# Patient Record
Sex: Male | Born: 1975 | Race: Black or African American | Hispanic: No | Marital: Married | Smoking: Never smoker
Health system: Southern US, Community
[De-identification: ages and names within clinical notes are randomized; demographics above are authoritative.]

## PROBLEM LIST (undated history)

## (undated) DIAGNOSIS — T7840XA Allergy, unspecified, initial encounter: Secondary | ICD-10-CM

## (undated) HISTORY — DX: Allergy, unspecified, initial encounter: T78.40XA

---

## 2004-06-11 ENCOUNTER — Emergency Department (HOSPITAL_COMMUNITY): Admission: EM | Admit: 2004-06-11 | Discharge: 2004-06-11 | Payer: Self-pay | Admitting: Family Medicine

## 2014-05-09 ENCOUNTER — Emergency Department (HOSPITAL_COMMUNITY)
Admission: EM | Admit: 2014-05-09 | Discharge: 2014-05-09 | Disposition: A | Discharge status: Home / Self Care | Payer: 59 | Source: Home / Self Care | Attending: Family Medicine | Admitting: Family Medicine

## 2014-05-09 ENCOUNTER — Encounter (HOSPITAL_COMMUNITY): Payer: Self-pay | Admitting: Emergency Medicine

## 2014-05-09 DIAGNOSIS — S39012A Strain of muscle, fascia and tendon of lower back, initial encounter: Secondary | ICD-10-CM

## 2014-05-09 MED ORDER — CYCLOBENZAPRINE HCL 5 MG PO TABS: 5.0000 mg | ORAL_TABLET | Freq: Three times a day (TID) | ORAL | Status: DC | PRN

## 2014-05-09 MED ORDER — DICLOFENAC POTASSIUM 50 MG PO TABS: 50.0000 mg | ORAL_TABLET | Freq: Three times a day (TID) | ORAL | Status: DC

## 2014-05-09 NOTE — ED Notes (Signed)
Pt states that he has had back pain for 2 weeks he may have hurt his back either at work or working out per pt.

## 2014-05-09 NOTE — ED Provider Notes (Signed)
CSN: [id number]     Arrival date & time 05/09/14  1416 History   First MD Initiated Contact with Patient 05/09/14 1437     Chief Complaint  Patient presents with  . Back Pain   (Consider location/radiation/quality/duration/timing/severity/associated sxs/prior Treatment) Patient is a 39 y.o. male presenting with back pain. The history is provided by the patient.  Back Pain Location:  Lumbar spine Quality:  Aching Radiates to:  Does not radiate Pain severity:  Mild Pain is:  Worse during the night Onset quality:  Gradual Duration:  2 weeks Progression:  Unchanged Chronicity:  New Context: lifting heavy objects   Relieved by:  None tried Exacerbated by: onset at night during sleep., better in am after up and about. 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   History reviewed. No pertinent past medical history. History reviewed. No pertinent past surgical history. History reviewed. No pertinent family history. History  Substance Use Topics  . Smoking status: Never Smoker   . Smokeless tobacco: Not on file  . Alcohol Use: No    Review of Systems  Constitutional: Negative.   Cardiovascular: Negative for chest pain.  Gastrointestinal: Negative.  Negative for abdominal pain and bowel incontinence.  Genitourinary: Negative.  Negative for bladder incontinence.  Musculoskeletal: Positive for back pain. Negative for gait problem.  Skin: Negative.   Neurological: Negative for tingling, weakness, numbness and paresthesias.    Allergies  Review of patient's allergies indicates no known allergies.  Home Medications   Prior to Admission medications   Medication Sig Start Date End Date Taking? Authorizing Provider  cyclobenzaprine (FLEXERIL) 5 MG tablet Take 1 tablet (5 mg total) by mouth 3 (three) times  daily as needed for muscle spasms. 05/09/14   Linna HoffJames D Alexandro Line, MD  diclofenac (CATAFLAM) 50 MG tablet Take 1 tablet (50 mg total) by mouth 3 (three) times daily. For back pain 05/09/14   Linna HoffJames D Jace Fermin, MD   BP 130/88 mmHg  Pulse 82  Temp(Src) 98.2 F (36.8 C) (Oral)  Resp 18  SpO2 100% Physical Exam  Constitutional: He is oriented to person, place, and time. He appears well-developed and well-nourished.  Abdominal: Soft. Bowel sounds are normal.  Musculoskeletal: Normal range of motion. He exhibits tenderness.       Lumbar back: He exhibits tenderness, pain and spasm. He exhibits normal range of motion, no bony tenderness, no swelling and normal pulse.  Neurological: He is alert and oriented to person, place, and time.  Skin: Skin is warm and dry.  Nursing note and vitals reviewed.   ED Course  Procedures (including critical care time) Labs Review Labs Reviewed - No data to display  Imaging Review No results found.   MDM   1. Low back strain, initial encounter        Linna HoffJames D Lailee Hoelzel, MD 05/09/14 [id number]

## 2014-07-10 ENCOUNTER — Ambulatory Visit (INDEPENDENT_AMBULATORY_CARE_PROVIDER_SITE_OTHER): Payer: 59 | Admitting: Family Medicine

## 2014-07-10 VITALS — BP 118/84 | HR 69 | Temp 98.3°F | Resp 16 | Ht 70.0 in | Wt 276.0 lb

## 2014-07-10 DIAGNOSIS — R300 Dysuria: Secondary | ICD-10-CM

## 2014-07-10 DIAGNOSIS — N390 Urinary tract infection, site not specified: Secondary | ICD-10-CM | POA: Diagnosis not present

## 2014-07-10 LAB — POCT URINALYSIS DIPSTICK
Bilirubin, UA: NEGATIVE
Blood, UA: NEGATIVE
GLUCOSE UA: NEGATIVE
Ketones, UA: NEGATIVE
Leukocytes, UA: NEGATIVE
NITRITE UA: NEGATIVE
Protein, UA: NEGATIVE
Spec Grav, UA: 1.025
Urobilinogen, UA: 0.2
pH, UA: 5.5

## 2014-07-10 LAB — POCT UA - MICROSCOPIC ONLY
BACTERIA, U MICROSCOPIC: NEGATIVE
CASTS, UR, LPF, POC: NEGATIVE
Crystals, Ur, HPF, POC: NEGATIVE
Epithelial cells, urine per micros: NEGATIVE
RBC, URINE, MICROSCOPIC: NEGATIVE
YEAST UA: NEGATIVE

## 2014-07-10 MED ORDER — CIPROFLOXACIN HCL 500 MG PO TABS
500.0000 mg | ORAL_TABLET | Freq: Two times a day (BID) | ORAL | Status: AC
Start: 2014-07-10 — End: 2014-07-20

## 2014-07-10 MED ORDER — PHENAZOPYRIDINE HCL 200 MG PO TABS: 200.0000 mg | ORAL_TABLET | Freq: Three times a day (TID) | ORAL | Status: DC | PRN

## 2014-07-10 NOTE — Progress Notes (Signed)
Discussed with Wallis BambergMario Mani, PA-C.  Symptoms are difficult to explain fully.  Agree with treatment plan.  Possibly psychogenic focusing on the genitalia, and if symptoms persist need to be sure to pursue with him what he is worried about.

## 2014-07-10 NOTE — Progress Notes (Signed)
    MRN: [id number] DOB: 03/18/1976  Subjective:   Caleb Frederick is a 39 y.o. male presenting for chief complaint of Urinary Tract Infection  Reports 3 day history of tingling sensation over penile head. Patient tried hydrocortisone cream 1-2x with some relief. Denies fevers, dysuria, hematuria, penile discharge, genital rashes, urinary frequency, nocturia, cloudy or malodorous urine. Patient is sexually active with 1 male, wears condoms. Denies history of sexually transmitted infections. Denies smoking or alcohol use. Denies any other aggravating or relieving factors, no other questions or concerns.  Caleb Frederick has a current medication list which includes the following prescription(s): cyclobenzaprine and diclofenac. He has No Known Allergies.  Caleb Frederick  has a past medical history of Allergy. Also  has no past surgical history on file.  ROS As in subjective.  Objective:   Vitals: BP 118/84 mmHg  Pulse 69  Temp(Src) 98.3 F (36.8 C) (Oral)  Resp 16  Ht 5\' 10"  (1.778 m)  Wt 276 lb (125.193 kg)  BMI 39.60 kg/m2  SpO2 98%  Physical Exam  Constitutional: He is oriented to person, place, and time.  Cardiovascular: Normal rate.   Pulmonary/Chest: Effort normal.  Genitourinary: He exhibits no abnormal testicular mass, no testicular tenderness, no abnormal scrotal mass, no scrotal tenderness and no epididymal tenderness. Penis exhibits no lesions and no edema. No discharge found.  Neurological: He is alert and oriented to person, place, and time.  Skin: Skin is warm and dry. No rash noted. No erythema. No pallor.  No inguinal lymphadenopathy.   Results for orders placed or performed in visit on 07/10/14 (from the past 24 hour(s))  POCT UA - Microscopic Only     Status: None   Collection Time: 07/10/14  3:00 PM  Result Value Ref Range   WBC, Ur, HPF, POC 0-2    RBC, urine, microscopic neg    Bacteria, U Microscopic neg    Mucus, UA small    Epithelial cells, urine per micros neg      Crystals, Ur, HPF, POC neg    Casts, Ur, LPF, POC neg    Yeast, UA neg   POCT urinalysis dipstick     Status: None   Collection Time: 07/10/14  3:00 PM  Result Value Ref Range   Color, UA yellow    Clarity, UA clear    Glucose, UA neg    Bilirubin, UA neg    Ketones, UA neg    Spec Grav, UA 1.025    Blood, UA neg    pH, UA 5.5    Protein, UA neg    Urobilinogen, UA 0.2    Nitrite, UA neg    Leukocytes, UA Negative    Assessment and Plan :   1. Urinary tract infection without hematuria, site unspecified 2. Dysuria - Unclear etiology for his penile head tingling sensation, may be psychogenic but labs and urine culture are pending, consider antibiotic course if symptoms worsen. Patient stated a voice message with results was okay.  Wallis BambergMario Hillarie Harrigan, PA-C [facility] [phone] 07/10/2014 2:53 PM

## 2014-07-10 NOTE — Patient Instructions (Addendum)
-   Start pyridium today for the next 5 days to help with your symptoms. - Hold on to ciprofloxacin prescription, start this in 3-5 days if your symptoms have not improved or worsen.    Urinary Tract Infection Urinary tract infections (UTIs) can develop anywhere along your urinary tract. Your urinary tract is your body's drainage system for removing wastes and extra water. Your urinary tract includes two kidneys, two ureters, a bladder, and a urethra. Your kidneys are a pair of bean-shaped organs. Each kidney is about the size of your fist. They are located below your ribs, one on each side of your spine. CAUSES Infections are caused by microbes, which are microscopic organisms, including fungi, viruses, and bacteria. These organisms are so small that they can only be seen through a microscope. Bacteria are the microbes that most commonly cause UTIs. SYMPTOMS  Symptoms of UTIs may vary by age and gender of the patient and by the location of the infection. Symptoms in young women typically include a frequent and intense urge to urinate and a painful, burning feeling in the bladder or urethra during urination. Older women and men are more likely to be tired, shaky, and weak and have muscle aches and abdominal pain. A fever may mean the infection is in your kidneys. Other symptoms of a kidney infection include pain in your back or sides below the ribs, nausea, and vomiting. DIAGNOSIS To diagnose a UTI, your caregiver will ask you about your symptoms. Your caregiver also will ask to provide a urine sample. The urine sample will be tested for bacteria and white blood cells. White blood cells are made by your body to help fight infection. TREATMENT  Typically, UTIs can be treated with medication. Because most UTIs are caused by a bacterial infection, they usually can be treated with the use of antibiotics. The choice of antibiotic and length of treatment depend on your symptoms and the type of bacteria causing  your infection. HOME CARE INSTRUCTIONS  If you were prescribed antibiotics, take them exactly as your caregiver instructs you. Finish the medication even if you feel better after you have only taken some of the medication.  Drink enough water and fluids to keep your urine clear or pale yellow.  Avoid caffeine, tea, and carbonated beverages. They tend to irritate your bladder.  Empty your bladder often. Avoid holding urine for long periods of time.  Empty your bladder before and after sexual intercourse.  After a bowel movement, women should cleanse from front to back. Use each tissue only once. SEEK MEDICAL CARE IF:   You have back pain.  You develop a fever.  Your symptoms do not begin to resolve within 3 days. SEEK IMMEDIATE MEDICAL CARE IF:   You have severe back pain or lower abdominal pain.  You develop chills.  You have nausea or vomiting.  You have continued burning or discomfort with urination. MAKE SURE YOU:   Understand these instructions.  Will watch your condition.  Will get help right away if you are not doing well or get worse. Document Released: 01/26/2005 Document Revised: 10/18/2011 Document Reviewed: 05/27/2011 [facility] Patient Information 2015 [place] [place]. This information is not intended to replace advice given to you by your health care provider. Make sure you discuss any questions you have with your health care provider.

## 2014-07-11 LAB — URINE CULTURE
Colony Count: NO GROWTH
Organism ID, Bacteria: NO GROWTH

## 2014-07-13 LAB — GC/CHLAMYDIA PROBE AMP
Chlamydia trachomatis, NAA: NEGATIVE
Neisseria gonorrhoeae by PCR: NEGATIVE

## 2014-12-26 ENCOUNTER — Ambulatory Visit (INDEPENDENT_AMBULATORY_CARE_PROVIDER_SITE_OTHER): Payer: Self-pay | Admitting: Family Medicine

## 2014-12-26 VITALS — BP 114/80 | HR 59 | Temp 98.3°F | Resp 18 | Ht 69.5 in | Wt 281.0 lb

## 2014-12-26 DIAGNOSIS — Z0000 Encounter for general adult medical examination without abnormal findings: Secondary | ICD-10-CM

## 2014-12-26 NOTE — Progress Notes (Signed)
[facility] [address] [place] [address] [phone]  Date:  12/26/2014   Name:  Caleb Frederick   DOB:  06/30/1975   MRN:  [id number]  PCP:  No primary care provider on file.    Chief Complaint: Employment Physical   History of Present Illness:  Caleb Frederick is a 39 y.o. 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  Generally in good health No medication or chronic health concerns   There are no active problems to display for this patient.   Past Medical History  Diagnosis Date  . Allergy     History reviewed. No pertinent past surgical history.  Social History  Substance Use Topics  . Smoking status: Never Smoker   . Smokeless tobacco: None  . Alcohol Use: No    Family History  Problem Relation Age of Onset  . Mental illness Mother   . Heart disease Maternal Grandmother     No Known Allergies  Medication list has been reviewed and updated.  No current outpatient prescriptions on file prior to visit.   No current facility-administered medications on file prior to visit.    Review of Systems:  As per HPI- otherwise negative.   Physical Examination: Filed Vitals:   12/26/14 1314  BP: 114/80  Pulse: 59  Temp: 98.3 F (36.8 C)  Resp: 18   Filed Vitals:   12/26/14 1314  Height: 5' 9.5" (1.765 m)  Weight: 281 lb (127.461 kg)   Body mass index is 40.92 kg/(m^2). Ideal Body Weight: Weight in (lb) to have BMI = 25: 171.4  GEN: WDWN, NAD, Non-toxic, A & O x 3, overweight, looks well HEENT: Atraumatic, Normocephalic. Neck supple. No masses, No LAD.  Bilateral TM wnl, oropharynx normal.  PEERL,EOMI.   Ears and Nose: No external deformity. CV: RRR, No M/G/R. No JVD. No thrill. No extra heart sounds. PULM: CTA B, no wheezes, crackles, rhonchi. No retractions. No resp. distress. No accessory muscle use. ABD: S, NT, ND. No rebound. No HSM. EXTR: No c/c/e NEURO Normal  gait.  PSYCH: Normally interactive. Conversant. Not depressed or anxious appearing.  Calm demeanor.  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

## 2015-05-12 ENCOUNTER — Ambulatory Visit (INDEPENDENT_AMBULATORY_CARE_PROVIDER_SITE_OTHER): Payer: Managed Care, Other (non HMO)

## 2015-05-12 ENCOUNTER — Ambulatory Visit (INDEPENDENT_AMBULATORY_CARE_PROVIDER_SITE_OTHER): Payer: Managed Care, Other (non HMO) | Admitting: Internal Medicine

## 2015-05-12 VITALS — BP 124/86 | HR 71 | Temp 97.9°F | Resp 18 | Ht 69.5 in | Wt 286.6 lb

## 2015-05-12 DIAGNOSIS — M542 Cervicalgia: Secondary | ICD-10-CM

## 2015-05-12 DIAGNOSIS — M5412 Radiculopathy, cervical region: Secondary | ICD-10-CM

## 2015-05-12 DIAGNOSIS — Z6841 Body Mass Index (BMI) 40.0 and over, adult: Secondary | ICD-10-CM | POA: Diagnosis not present

## 2015-05-12 MED ORDER — CYCLOBENZAPRINE HCL 10 MG PO TABS: 10.0000 mg | ORAL_TABLET | Freq: Every day | ORAL | Status: DC

## 2015-05-12 MED ORDER — MELOXICAM 15 MG PO TABS: 15.0000 mg | ORAL_TABLET | Freq: Every day | ORAL | Status: DC

## 2015-05-12 MED ORDER — PREDNISONE 20 MG PO TABS: ORAL_TABLET | ORAL | Status: DC

## 2015-05-12 NOTE — Progress Notes (Addendum)
Subjective:  By signing my name below, I, [name], attest that this documentation has been prepared under the direction and in the presence of [name], MD.  Watt Climes Rifaie, Medical Scribe. 05/12/2015.  1:03 PM.   Patient ID: Caleb Frederick, male    DOB: [birth year], 40 y.o.   MRN: [id number]  Chief Complaint  Patient presents with  . Neck Pain    extending down arm    HPI HPI Comments: Caleb Frederick is a 40 y.o. male who [facility] complaining of right neck pain, gradual onset 3-4 months ago.  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 He indicates that he bleieved that his symptoms initally were secondary to his sleeping position in the truck. He indicates that he was taking Ibuprofen for the intermittent symptoms which he found relief with. Pt reports that recently, the pain started radiating to his left arm with associated weakness of the arm. He also reports associated difficulty sleeping due to the pain. Pt denies previous neck injury, tingling of the left arm.    Healthy on no meds.   There are no active problems to display for this patient.  Past Medical History  Diagnosis Date  . Allergy    History reviewed. No pertinent past surgical history. No Known Allergies Prior to Admission medications   Medication Sig Start Date End Date Taking? Authorizing Provider  IBUPROFEN PO Take by mouth.   Yes Historical Provider, MD   Social History   Social History  . Marital Status: Single    Spouse Name: N/A  . Number of Children: N/A  . Years of Education: N/A   Occupational History  . Not on file.   Social History Main Topics  . Smoking status: Never Smoker   . Smokeless tobacco: Not on file  . Alcohol Use: No  . Drug Use: No  . Sexual Activity: Not on file   Other Topics Concern  . Not on file   Social History Narrative    Review of Systems   Constitutional: Negative for fatigue and unexpected weight change.  Eyes: Negative for visual disturbance.  Respiratory: Negative for shortness of breath.   Cardiovascular: Negative for chest pain.  Musculoskeletal: Positive for myalgias, neck pain and neck stiffness. Negative for gait problem.  Neurological: Negative for dizziness, numbness and headaches.  Psychiatric/Behavioral: Positive for sleep disturbance.      Objective:   Physical Exam  Constitutional: He is oriented to person, place, and time. He appears well-developed and well-nourished. No distress.  HENT:  Head: Normocephalic and atraumatic.  Eyes: Conjunctivae and EOM are normal. Pupils are equal, round, and reactive to light.  Neck: Neck supple.  Neck rom decr due to discomfort, espec rot to L and extens  Cardiovascular: Normal rate.   Pulmonary/Chest: Effort normal.  Musculoskeletal:   Shoulder L ROM is intact.Tender to palpation to the left posterior cervical and left trapezius.  Grip str good bilat w/out sens losses in upper extr  Lymphadenopathy:    He has no cervical adenopathy.  Neurological: He is alert and oriented to person, place, and time. No cranial nerve deficit.  Skin: Skin is warm and dry.  Psychiatric: He has a normal mood and affect. His behavior is normal.  Nursing note and vitals reviewed.    BP 124/86 mmHg  Pulse 71  Temp(Src) 97.9 F (36.6 C) (Oral)  Resp 18  Ht 5' 9.5" (1.765 m)  Wt 286 lb 9.6 oz (130.001 kg)  BMI 41.73 kg/m2  SpO2 98% [facility] reading (PRIMARY) by  Dr. Anhar Mcdermott=no narrowing or degen changes.      Assessment & Plan:  Neck pain - Plan: DG Cervical Spine 2 or 3 views  Cervical radiculitis - Plan: DG Cervical Spine 2 or 3 views  This should be muscle strain injury with repetitive reinjury driving.  Meds ordered this encounter  Medications  . IBUPROFEN PO    Sig: Take by mouth.  . cyclobenzaprine (FLEXERIL) 10 MG tablet    Sig: Take 1 tablet (10 mg total) by mouth  at bedtime.    Dispense:  30 tablet    Refill:  0  . predniSONE (DELTASONE) 20 MG tablet    Sig: 3/3/2/2/1/1 single daily dose for 6 days    Dispense:  12 tablet    Refill:  0  . meloxicam (MOBIC) 15 MG tablet    Sig: Take 1 tablet (15 mg total) by mouth daily.    Dispense:  30 tablet    Refill:  0   Neck rom To pt 2 weeks if not well  I have completed the patient encounter in its entirety as documented by the scribe, with editing by me where necessary. Caleb Frederick Caleb Frederick, M.D.

## 2016-02-15 HISTORY — PX: VASECTOMY: SHX75

## 2016-04-11 ENCOUNTER — Telehealth (HOSPITAL_COMMUNITY): Payer: Self-pay | Admitting: Emergency Medicine

## 2016-04-11 ENCOUNTER — Ambulatory Visit (HOSPITAL_COMMUNITY)
Admission: EM | Admit: 2016-04-11 | Discharge: 2016-04-11 | Disposition: A | Discharge status: Home / Self Care | Payer: Managed Care, Other (non HMO) | Attending: Family Medicine | Admitting: Family Medicine

## 2016-04-11 ENCOUNTER — Encounter (HOSPITAL_COMMUNITY): Payer: Self-pay | Admitting: Emergency Medicine

## 2016-04-11 DIAGNOSIS — B9789 Other viral agents as the cause of diseases classified elsewhere: Secondary | ICD-10-CM | POA: Diagnosis not present

## 2016-04-11 DIAGNOSIS — J069 Acute upper respiratory infection, unspecified: Secondary | ICD-10-CM | POA: Diagnosis not present

## 2016-04-11 MED ORDER — PHENYLEPHRINE-CHLORPHEN-DM 10-4-12.5 MG/5ML PO LIQD: 5.0000 mL | ORAL | 0 refills | Status: DC | PRN

## 2016-04-11 NOTE — Telephone Encounter (Signed)
Hayden Rasmussenavid Mabe, NP spoke with pharmacy

## 2016-04-11 NOTE — ED Provider Notes (Signed)
CSN: [id number]     Arrival date & time 04/11/16  1701 History   First MD Initiated Contact with Patient 04/11/16 1756     Chief Complaint  Patient presents with  . Cough   (Consider location/radiation/quality/duration/timing/severity/associated sxs/prior Treatment) 40 year old male complaining of cough and congestion. Complaining of head congestion and chest congestion. Symptoms began about a week ago. Denies fever or chills. 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 Denies fever.      Past Medical History:  Diagnosis Date  . Allergy    History reviewed. No pertinent surgical history. Family History  Problem Relation Age of Onset  . Mental illness Mother   . Heart disease Maternal Grandmother    Social History  Substance Use Topics  . Smoking status: Never Smoker  . Smokeless tobacco: Not on file  . Alcohol use No    Review of Systems  Constitutional: Positive for activity change. Negative for chills, diaphoresis, fatigue and fever.  HENT: Positive for congestion, postnasal drip and rhinorrhea. Negative for ear pain, facial swelling, sore throat and trouble swallowing.   Eyes: Negative for pain, discharge and redness.  Respiratory: Positive for cough. Negative for chest tightness and shortness of breath.   Cardiovascular: Negative.   Gastrointestinal: Negative.   Musculoskeletal: Negative.  Negative for neck pain and neck stiffness.  Neurological: Negative.   All other systems reviewed and are negative.   Allergies  Patient has no known allergies.  Home Medications   Prior to Admission medications   Medication Sig Start Date End Date Taking? Authorizing Provider  cyclobenzaprine (FLEXERIL) 10 MG tablet Take 1 tablet (10 mg total) by mouth at bedtime. 05/12/15   Tonye Pearsonobert P Doolittle, MD  IBUPROFEN PO Take by mouth.    Historical Provider, MD  meloxicam (MOBIC) 15 MG tablet Take 1 tablet (15 mg total) by mouth daily. 05/12/15    Tonye Pearsonobert P Doolittle, MD  predniSONE (DELTASONE) 20 MG tablet 3/3/2/2/1/1 single daily dose for 6 days 05/12/15   Tonye Pearsonobert P Doolittle, MD   Meds Ordered and Administered this Visit  Medications - No data to display  BP 132/82 (BP Location: Right Arm)   Pulse 82   Temp 98.1 F (36.7 C) (Oral)   Resp 18   SpO2 100%  No data found.   Physical Exam  Constitutional: He is oriented to person, place, and time. He appears well-developed and well-nourished. No distress.  HENT:  Head: Normocephalic and atraumatic.  Right Ear: External ear normal.  Left Ear: External ear normal.  Mouth/Throat: No oropharyngeal exudate.  Bilateral TMs are normal. Oropharynx with minimal erythema and moderate amount of clear PND.  Eyes: EOM are normal. Pupils are equal, round, and reactive to light.  Neck: Normal range of motion. Neck supple.  Cardiovascular: Normal rate, regular rhythm and normal heart sounds.   Pulmonary/Chest: Effort normal and breath sounds normal. No respiratory distress. He has no wheezes. He has no rales.  Musculoskeletal: Normal range of motion. He exhibits no edema.  Neurological: He is alert and oriented to person, place, and time.  Skin: Skin is warm and dry.  Nursing note and vitals reviewed.   Urgent [facility] Course   Clinical Course     Procedures (including critical care time)  Labs Review Labs Reviewed - No data to display  Imaging Review No results found.   Visual Acuity Review  Right Eye Distance:   Left Eye Distance:   Bilateral Distance:  Right Eye Near:   Left Eye Near:    Bilateral Near:         MDM   1. Viral URI with cough    Sudafed PE 10 mg every 4 to 6 hours as needed for congestion Allegra or Zyrtec daily as needed for drainage and runny nose. Saline nasal spray used frequently. Ibuprofen 600 mg every 6 hours as needed for pain, discomfort or fever. Drink plenty of fluids and stay well-hydrated  For Home Use You are receiving a  prescription bottle of medication for drainage, congestion and cough. He may take this when at home and not driving. When taking these medications he did not need to take additional decongestants or antihistamines.      Hayden Rasmussenavid Shirlette Scarber, NP 04/11/16 [phone]

## 2016-04-11 NOTE — ED Triage Notes (Signed)
[facility] a [facility] with nasal and chest congestion x 1 week. The patient reported using OTC meds with minimal results.

## 2016-04-11 NOTE — Discharge Instructions (Signed)
Sudafed PE 10 mg every 4 to 6 hours as needed for congestion Allegra or Zyrtec daily as needed for drainage and runny nose. Saline nasal spray used frequently. Ibuprofen 600 mg every 6 hours as needed for pain, discomfort or fever. Drink plenty of fluids and stay well-hydrated  For Home Use You are receiving a prescription bottle of medication for drainage, congestion and cough. He may take this when at home and not driving. When taking these medications he did not need to take additional decongestants or antihistamines.

## 2016-10-12 ENCOUNTER — Encounter (HOSPITAL_COMMUNITY): Payer: Self-pay | Admitting: Emergency Medicine

## 2016-10-12 ENCOUNTER — Ambulatory Visit (HOSPITAL_COMMUNITY)
Admission: EM | Admit: 2016-10-12 | Discharge: 2016-10-12 | Disposition: A | Discharge status: Home / Self Care | Payer: 59 | Attending: Family Medicine | Admitting: Family Medicine

## 2016-10-12 DIAGNOSIS — T7849XA Other allergy, initial encounter: Secondary | ICD-10-CM | POA: Insufficient documentation

## 2016-10-12 DIAGNOSIS — R300 Dysuria: Secondary | ICD-10-CM

## 2016-10-12 DIAGNOSIS — T7840XA Allergy, unspecified, initial encounter: Secondary | ICD-10-CM | POA: Diagnosis not present

## 2016-10-12 DIAGNOSIS — X58XXXA Exposure to other specified factors, initial encounter: Secondary | ICD-10-CM | POA: Diagnosis not present

## 2016-10-12 DIAGNOSIS — R3915 Urgency of urination: Secondary | ICD-10-CM

## 2016-10-12 LAB — POCT URINALYSIS DIP (DEVICE)
Bilirubin Urine: NEGATIVE
Glucose, UA: NEGATIVE mg/dL
Hgb urine dipstick: NEGATIVE
Ketones, ur: NEGATIVE mg/dL
Leukocytes, UA: NEGATIVE
Nitrite: NEGATIVE
PH: 5.5 (ref 5.0–8.0)
Protein, ur: NEGATIVE mg/dL
Specific Gravity, Urine: >=1.03 (ref 1.005–1.030)
UROBILINOGEN UA: 0.2 mg/dL (ref 0.0–1.0)

## 2016-10-12 LAB — GLUCOSE, CAPILLARY: Glucose-Capillary: 114 mg/dL — ABNORMAL HIGH (ref 65–99)

## 2016-10-12 MED ORDER — PREDNISONE 10 MG (21) PO TBPK: ORAL_TABLET | ORAL | 0 refills | Status: DC

## 2016-10-12 NOTE — Discharge Instructions (Signed)
To cover for possible allergic reaction, prescribed prednisone, take as directed, also take over-the-counter Benadryl 2 tablets every 6 hours not to exceed 300 mg any 24-hour period. Urine is also being tested for further infective causes, if positive we will notify used 3-5 business days. If your symptoms persist return to clinic as needed, or follow-up with urology.I have attached the contact information for [facility], contact them to schedule an appointment to establish for primary care.

## 2016-10-12 NOTE — ED Provider Notes (Signed)
CSN: [id number]     Arrival date & time 10/12/16  1652 History   First MD Initiated Contact with Patient 10/12/16 1729     Chief Complaint  Patient presents with  . Urinary Urgency   (Consider location/radiation/quality/duration/timing/severity/associated sxs/prior Treatment) 41 year old male presents to clinic with a chief complaint of urinary discomfort. States that previously he has had an allergic reaction to condoms, and K-Y jelly, in that he use these products recently, and believes he might be having another reaction. He is also concerned about possible UTI. However, he is not concerned with STDs, stating he is monogamous relationship. He has had no penile discharge, sores, lesions, lymphadenopathy, fever, chills, or other systemic symptoms. He is also requesting to have his blood sugar checked, and concerned about the possibility of diabetes.   The history is provided by the patient.    Past Medical History:  Diagnosis Date  . Allergy    History reviewed. No pertinent surgical history. Family History  Problem Relation Age of Onset  . Mental illness Mother   . Heart disease Maternal Grandmother    Social History  Substance Use Topics  . Smoking status: Never Smoker  . Smokeless tobacco: Not on file  . Alcohol use No    Review of Systems  Constitutional: Negative.   HENT: Negative.   Respiratory: Negative.   Cardiovascular: Negative.   Gastrointestinal: Negative.   Genitourinary: Positive for dysuria. Negative for discharge, frequency, penile swelling and urgency.  Musculoskeletal: Negative.   Skin: Negative.   Neurological: Negative.     Allergies  Patient has no known allergies.  Home Medications   Prior to Admission medications   Medication Sig Start Date End Date Taking? Authorizing Provider  predniSONE (STERAPRED UNI-PAK 21 TAB) 10 MG (21) TBPK tablet Take 6 tablets tomorrow, decrease by 1 each day till finished (7,8,2,9,5,6) 10/12/16   Dorena Bodo,  NP   Meds Ordered and Administered this Visit  Medications - No data to display  BP 130/82 (BP Location: Right Arm)   Pulse 78   Temp 98.2 F (36.8 C) (Oral)   Resp 18   SpO2 98%  No data found.   Physical Exam  Constitutional: He is oriented to person, place, and time. He appears well-developed and well-nourished. No distress.  HENT:  Head: Normocephalic and atraumatic.  Right Ear: External ear normal.  Left Ear: External ear normal.  Eyes: Conjunctivae are normal.  Neck: Normal range of motion.  Genitourinary: Right testis shows no tenderness. Left testis shows no tenderness. Circumcised. Penile erythema present. No discharge found.  Genitourinary Comments: Area of erythema near the urinary meatus, no discharge or other lesions noted.  Lymphadenopathy: No inguinal adenopathy noted on the right or left side.  Neurological: He is alert and oriented to person, place, and time.  Skin: Skin is warm and dry. Capillary refill takes less than 2 seconds. He is not diaphoretic.  Psychiatric: He has a normal mood and affect. His behavior is normal.  Nursing note and vitals reviewed.   Urgent Care Course     Procedures (including critical care time)  Labs Review Labs Reviewed  GLUCOSE, CAPILLARY - Abnormal; Notable for the following:       Result Value   Glucose-Capillary 114 (*)    All other components within normal limits  POCT URINALYSIS DIP (DEVICE)  URINE CYTOLOGY ANCILLARY ONLY    Imaging Review No results found.      MDM   1. Allergic reaction, initial encounter  Patient's glucose tested at his request, counseling was provided with regard to single reading not being diagnostic catheter for against diabetes and the importance to establish with primary care for further evaluation.  Treating for allergic reaction, given prednisone, provided counseling on over-the-counter Benadryl use. Urine sent to lab for testing for gonorrhea, chlamydia, Trichomonas, symptoms  persist return to clinic, follow-up with primary care.      Dorena BodoKennard, Renetta Suman, NP 10/12/16 1844

## 2016-10-12 NOTE — ED Notes (Signed)
Dirty and clean urine collected. 

## 2016-10-12 NOTE — ED Triage Notes (Signed)
[facility] with a complaint of urinary urgency and some occasional pain to the tip of his penis. The patient denied any penile discharge. The patient 4 days.

## 2016-10-14 LAB — URINE CYTOLOGY ANCILLARY ONLY
CHLAMYDIA, DNA PROBE: NEGATIVE
NEISSERIA GONORRHEA: NEGATIVE
TRICH (WINDOWPATH): NEGATIVE

## 2016-12-07 ENCOUNTER — Ambulatory Visit (INDEPENDENT_AMBULATORY_CARE_PROVIDER_SITE_OTHER): Payer: Self-pay | Admitting: Family Medicine

## 2016-12-07 ENCOUNTER — Encounter: Payer: Self-pay | Admitting: Family Medicine

## 2016-12-07 VITALS — BP 129/84 | HR 65 | Temp 98.5°F | Resp 18 | Ht 69.25 in | Wt 296.6 lb

## 2016-12-07 DIAGNOSIS — Z024 Encounter for examination for driving license: Secondary | ICD-10-CM

## 2016-12-07 NOTE — Progress Notes (Signed)
Commercial Driver Medical Examination   Caleb HagemanJermaine Frederick is a 41 y.o. male who presents today for a commercial driver fitness determination physical exam. The patient reports no problems. The following portions of the patient's history were reviewed and updated as appropriate: allergies, current medications, past family history, past medical history, past social history, past surgical history and problem list. Review of Systems Pertinent items are noted in HPI.   Objective:    Vision:   Visual Acuity Screening   Right eye Left eye Both eyes  With correction:     Without correction: 20/15 20/15 20/15   Comments: R 85 degrees  L 85 degrees  Able to distinguish colors   Hearing Screening Comments: R 3915ft L  7615ft  Applicant can recognize and distinguish among traffic control signals and devices showing standard red, green, and amber colors.     Monocular Vision?: No   Hearing:     BP 129/84   Pulse 65   Temp 98.5 F (36.9 C) (Oral)   Resp 18   Ht 5' 9.25" (1.759 m)   Wt 296 lb 9.6 oz (134.5 kg)   SpO2 99%   BMI 43.48 kg/m   General Appearance:    Alert, cooperative, no distress, appears stated age  Head:    Normocephalic, without obvious abnormality, atraumatic  Eyes:    PERRL, conjunctiva/corneas clear, EOM's intact, fundi    benign, both eyes       Ears:    Normal TM's and external ear canals, both ears  Nose:   Nares normal, septum midline, mucosa normal, no drainage    or sinus tenderness  Throat:   Lips, mucosa, and tongue normal; teeth and gums normal  Neck:   Supple, symmetrical, trachea midline, no adenopathy;       thyroid:  No enlargement/tenderness/nodules; no carotid   bruit or JVD  Back:     Symmetric, no curvature, ROM normal, no CVA tenderness  Lungs:     Clear to auscultation bilaterally, respirations unlabored  Chest wall:    No tenderness or deformity  Heart:    Regular rate and rhythm, S1 and S2 normal, no murmur, rub   or gallop  Abdomen:      Soft, non-tender, bowel sounds active all four quadrants,    no masses, no organomegaly        Extremities:   Extremities normal, atraumatic, no cyanosis or edema  Pulses:   2+ and symmetric all extremities  Skin:   Skin color, texture, turgor normal, no rashes or lesions  Lymph nodes:   Cervical, supraclavicular, and axillary nodes normal  Neurologic:   CNII-XII intact. Normal strength, sensation and reflexes      throughout    Labs: Lab Results  Component Value Date   SPECGRAV 1.025 07/10/2014   PROTEINUR NEGATIVE 10/12/2016   BILIRUBINUR NEGATIVE 10/12/2016   GLUCOSEU NEGATIVE 10/12/2016   SG 1.020, not prot, no bld, no sugar    Assessment:    Healthy male exam.  Meets standards in 5949 CFR 391.41;  qualifies for 2 year certificate.    Plan:    Medical examiners certificate completed and printed. Return as needed.

## 2016-12-07 NOTE — Patient Instructions (Signed)
     IF you received an x-ray today, you will receive an invoice from [facility]. Please contact [facility] at [phone] with questions or concerns regarding your invoice.   IF you received labwork today, you will receive an invoice from [facility]. Please contact [facility] at [phone] with questions or concerns regarding your invoice.   Our billing staff will not be able to assist you with questions regarding bills from these companies.  You will be contacted with the lab results as soon as they are available. The fastest way to get your results is to activate your My Chart account. Instructions are located on the last page of this paperwork. If you have not heard from us regarding the results in 2 weeks, please contact this office.     

## 2017-08-17 ENCOUNTER — Other Ambulatory Visit: Payer: Self-pay

## 2017-08-17 ENCOUNTER — Encounter: Payer: Self-pay | Admitting: Family Medicine

## 2017-08-17 ENCOUNTER — Ambulatory Visit (INDEPENDENT_AMBULATORY_CARE_PROVIDER_SITE_OTHER): Payer: 59 | Admitting: Family Medicine

## 2017-08-17 VITALS — BP 100/60 | HR 94 | Temp 98.0°F | Ht 70.0 in | Wt 294.0 lb

## 2017-08-17 DIAGNOSIS — M545 Low back pain, unspecified: Secondary | ICD-10-CM

## 2017-08-17 MED ORDER — CYCLOBENZAPRINE HCL 10 MG PO TABS: 10.0000 mg | ORAL_TABLET | Freq: Three times a day (TID) | ORAL | 0 refills | Status: DC | PRN

## 2017-08-17 MED ORDER — IBUPROFEN 800 MG PO TABS: 800.0000 mg | ORAL_TABLET | Freq: Three times a day (TID) | ORAL | 0 refills | Status: DC | PRN

## 2017-08-17 MED ORDER — GABAPENTIN 300 MG PO CAPS: 300.0000 mg | ORAL_CAPSULE | Freq: Three times a day (TID) | ORAL | 0 refills | Status: DC | PRN

## 2017-08-17 NOTE — Patient Instructions (Addendum)
     IF you received an x-ray today, you will receive an invoice from [facility]. Please contact [facility] at [phone] with questions or concerns regarding your invoice.   IF you received labwork today, you will receive an invoice from [facility]. Please contact [facility] at [phone] with questions or concerns regarding your invoice.   Our billing staff will not be able to assist you with questions regarding bills from these companies.  You will be contacted with the lab results as soon as they are available. The fastest way to get your results is to activate your My Chart account. Instructions are located on the last page of this paperwork. If you have not heard from us regarding the results in 2 weeks, please contact this office.      Back Pain, Adult Back pain is very common. The pain often gets better over time. The cause of back pain is usually not dangerous. Most people can learn to manage their back pain on their own. Follow these instructions at home: Watch your back pain for any changes. The following actions may help to lessen any pain you are feeling:  Stay active. Start with short walks on flat ground if you can. Try to walk farther each day.  Exercise regularly as told by your doctor. Exercise helps your back heal faster. It also helps avoid future injury by keeping your muscles strong and flexible.  Do not sit, drive, or stand in one place for more than 30 minutes.  Do not stay in bed. Resting more than 1-2 days can slow down your recovery.  Be careful when you bend or lift an object. Use good form when lifting: ? Bend at your knees. ? Keep the object close to your body. ? Do not twist.  Sleep on a firm mattress. Lie on your side, and bend your knees. If you lie on your back, put a pillow under your knees.  Take medicines only as told by your doctor.  Put ice on the injured area. ? Put ice in a plastic bag. ? Place a towel between your  skin and the bag. ? Leave the ice on for 20 minutes, 2-3 times a day for the first 2-3 days. After that, you can switch between ice and heat packs.  Avoid feeling anxious or stressed. Find good ways to deal with stress, such as exercise.  Maintain a healthy weight. Extra weight puts stress on your back.  Contact a doctor if:  You have pain that does not go away with rest or medicine.  You have worsening pain that goes down into your legs or buttocks.  You have pain that does not get better in one week.  You have pain at night.  You lose weight.  You have a fever or chills. Get help right away if:  You cannot control when you poop (bowel movement) or pee (urinate).  Your arms or legs feel weak.  Your arms or legs lose feeling (numbness).  You feel sick to your stomach (nauseous) or throw up (vomit).  You have belly (abdominal) pain.  You feel like you may pass out (faint). This information is not intended to replace advice given to you by your health care provider. Make sure you discuss any questions you have with your health care provider. Document Released: 10/05/2007 Document Revised: 09/24/2015 Document Reviewed: 08/20/2013 Elsevier Interactive Patient Education  2018 Elsevier Inc.  
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
## 2017-08-25 ENCOUNTER — Ambulatory Visit (INDEPENDENT_AMBULATORY_CARE_PROVIDER_SITE_OTHER): Payer: 59 | Admitting: Family Medicine

## 2017-08-25 ENCOUNTER — Encounter: Payer: Self-pay | Admitting: Family Medicine

## 2017-08-25 ENCOUNTER — Other Ambulatory Visit: Payer: Self-pay

## 2017-08-25 VITALS — BP 132/84 | HR 76 | Temp 98.1°F | Ht 70.67 in | Wt 297.0 lb

## 2017-08-25 DIAGNOSIS — M545 Low back pain, unspecified: Secondary | ICD-10-CM

## 2017-08-25 MED ORDER — METHOCARBAMOL 750 MG PO TABS: 750.0000 mg | ORAL_TABLET | Freq: Four times a day (QID) | ORAL | 0 refills | Status: DC

## 2017-08-25 NOTE — Patient Instructions (Addendum)
IF you received an x-ray today, you will receive an invoice from [facility]. Please contact [facility] at [phone] with questions or concerns regarding your invoice.   IF you received labwork today, you will receive an invoice from [facility]. Please contact [facility] at [phone] with questions or concerns regarding your invoice.   Our billing staff will not be able to assist you with questions regarding bills from these companies.  You will be contacted with the lab results as soon as they are available. The fastest way to get your results is to activate your My Chart account. Instructions are located on the last page of this paperwork. If you have not heard from us regarding the results in 2 weeks, please contact this office.     Low Back Strain Rehab Ask your health care provider which exercises are safe for you. Do exercises exactly as told by your health care provider and adjust them as directed. It is normal to feel mild stretching, pulling, tightness, or discomfort as you do these exercises, but you should stop right away if you feel sudden pain or your pain gets worse. Do not begin these exercises until told by your health care provider. Stretching and range of motion exercises These exercises warm up your muscles and joints and improve the movement and flexibility of your back. These exercises also help to relieve pain, numbness, and tingling. Exercise A: Single knee to chest  1. Lie on your back on a firm surface with both legs straight. 2. Bend one of your knees. Use your hands to move your knee up toward your chest until you feel a gentle stretch in your lower back and buttock. ? Hold your leg in this position by holding onto the front of your knee. ? Keep your other leg as straight as possible. 3. Hold for __________ seconds. 4. Slowly return to the starting position. 5. Repeat with your other leg. Repeat __________ times. Complete this exercise  __________ times a day. Exercise B: Prone extension on elbows  1. Lie on your abdomen on a firm surface. 2. Prop yourself up on your elbows. 3. Use your arms to help lift your chest up until you feel a gentle stretch in your abdomen and your lower back. ? This will place some of your body weight on your elbows. If this is uncomfortable, try stacking pillows under your chest. ? Your hips should stay down, against the surface that you are lying on. Keep your hip and back muscles relaxed. 4. Hold for __________ seconds. 5. Slowly relax your upper body and return to the starting position. Repeat __________ times. Complete this exercise __________ times a day. Strengthening exercises These exercises build strength and endurance in your back. Endurance is the ability to use your muscles for a long time, even after they get tired. Exercise C: Pelvic tilt 1. Lie on your back on a firm surface. Bend your knees and keep your feet flat. 2. Tense your abdominal muscles. Tip your pelvis up toward the ceiling and flatten your lower back into the floor. ? To help with this exercise, you may place a small towel under your lower back and try to push your back into the towel. 3. Hold for __________ seconds. 4. Let your muscles relax completely before you repeat this exercise. Repeat __________ times. Complete this exercise __________ times a day. Exercise D: Alternating arm and leg raises  1. Get on your hands and knees on a firm surface. If you are on   a hard floor, you may want to use padding to cushion your knees, such as an exercise mat. 2. Line up your arms and legs. Your hands should be below your shoulders, and your knees should be below your hips. 3. Lift your left leg behind you. At the same time, raise your right arm and straighten it in front of you. ? Do not lift your leg higher than your hip. ? Do not lift your arm higher than your shoulder. ? Keep your abdominal and back muscles tight. ? Keep  your hips facing the ground. ? Do not arch your back. ? Keep your balance carefully, and do not hold your breath. 4. Hold for __________ seconds. 5. Slowly return to the starting position and repeat with your right leg and your left arm. Repeat __________ times. Complete this exercise __________times a day. Exercise J: Single leg lower with bent knees 1. Lie on your back on a firm surface. 2. Tense your abdominal muscles and lift your feet off the floor, one foot at a time, so your knees and hips are bent in an "L" shape (at about 90 degrees). ? Your knees should be over your hips and your lower legs should be parallel to the floor. 3. Keeping your abdominal muscles tense and your knee bent, slowly lower one of your legs so your toe touches the ground. 4. Lift your leg back up to return to the starting position. ? Do not hold your breath. ? Do not let your back arch. Keep your back flat against the ground. 5. Repeat with your other leg. Repeat __________ times. Complete this exercise __________ times a day. Posture and body mechanics  Body mechanics refers to the movements and positions of your body while you do your daily activities. Posture is part of body mechanics. Good posture and healthy body mechanics can help to relieve stress in your body's tissues and joints. Good posture means that your spine is in its natural S-curve position (your spine is neutral), your shoulders are pulled back slightly, and your head is not tipped forward. The following are general guidelines for applying improved posture and body mechanics to your everyday activities. Standing   When standing, keep your spine neutral and your feet about hip-width apart. Keep a slight bend in your knees. Your ears, shoulders, and hips should line up.  When you do a task in which you stand in one place for a long time, place one foot up on a stable object that is 2-4 inches (5-10 cm) high, such as a footstool. This helps keep  your spine neutral. Sitting   When sitting, keep your spine neutral and keep your feet flat on the floor. Use a footrest, if necessary, and keep your thighs parallel to the floor. Avoid rounding your shoulders, and avoid tilting your head forward.  When working at a desk or a computer, keep your desk at a height where your hands are slightly lower than your elbows. Slide your chair under your desk so you are close enough to maintain good posture.  When working at a computer, place your monitor at a height where you are looking straight ahead and you do not have to tilt your head forward or downward to look at the screen. Resting   When lying down and resting, avoid positions that are most painful for you.  If you have pain with activities such as sitting, bending, stooping, or squatting (flexion-based activities), lie in a position in which your body   does not bend very much. For example, avoid curling up on your side with your arms and knees near your chest (fetal position).  If you have pain with activities such as standing for a long time or reaching with your arms (extension-based activities), lie with your spine in a neutral position and bend your knees slightly. Try the following positions: ? Lying on your side with a pillow between your knees. ? Lying on your back with a pillow under your knees. Lifting   When lifting objects, keep your feet at least shoulder-width apart and tighten your abdominal muscles.  Bend your knees and hips and keep your spine neutral. It is important to lift using the strength of your legs, not your back. Do not lock your knees straight out.  Always ask for help to lift heavy or awkward objects. This information is not intended to replace advice given to you by your health care provider. Make sure you discuss any questions you have with your health care provider. Document Released: 04/18/2005 Document Revised: 12/24/2015 Document Reviewed:  01/28/2015 Elsevier Interactive Patient Education  2018 [employer] Inc.  
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
## 2017-09-01 ENCOUNTER — Other Ambulatory Visit: Payer: Self-pay

## 2017-09-01 ENCOUNTER — Encounter: Payer: Self-pay | Admitting: Family Medicine

## 2017-09-01 ENCOUNTER — Ambulatory Visit (INDEPENDENT_AMBULATORY_CARE_PROVIDER_SITE_OTHER): Payer: 59 | Admitting: Family Medicine

## 2017-09-01 VITALS — BP 132/82 | HR 93 | Temp 97.8°F | Ht 70.67 in | Wt 294.0 lb

## 2017-09-01 DIAGNOSIS — M545 Low back pain, unspecified: Secondary | ICD-10-CM

## 2017-09-01 NOTE — Patient Instructions (Signed)
     IF you received an x-ray today, you will receive an invoice from [facility]. Please contact [facility] at [phone] with questions or concerns regarding your invoice.   IF you received labwork today, you will receive an invoice from [facility]. Please contact [facility] at [phone] with questions or concerns regarding your invoice.   Our billing staff will not be able to assist you with questions regarding bills from these companies.  You will be contacted with the lab results as soon as they are available. The fastest way to get your results is to activate your My Chart account. Instructions are located on the last page of this paperwork. If you have not heard from us regarding the results in 2 weeks, please contact this office.     
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
## 2017-10-30 ENCOUNTER — Ambulatory Visit (HOSPITAL_COMMUNITY)
Admission: EM | Admit: 2017-10-30 | Discharge: 2017-10-30 | Disposition: A | Discharge status: Home / Self Care | Payer: 59 | Attending: Family Medicine | Admitting: Family Medicine

## 2017-10-30 ENCOUNTER — Encounter (HOSPITAL_COMMUNITY): Payer: Self-pay | Admitting: Emergency Medicine

## 2017-10-30 DIAGNOSIS — Z818 Family history of other mental and behavioral disorders: Secondary | ICD-10-CM | POA: Insufficient documentation

## 2017-10-30 DIAGNOSIS — Z8249 Family history of ischemic heart disease and other diseases of the circulatory system: Secondary | ICD-10-CM | POA: Insufficient documentation

## 2017-10-30 DIAGNOSIS — Z202 Contact with and (suspected) exposure to infections with a predominantly sexual mode of transmission: Secondary | ICD-10-CM | POA: Insufficient documentation

## 2017-10-30 DIAGNOSIS — Z791 Long term (current) use of non-steroidal anti-inflammatories (NSAID): Secondary | ICD-10-CM | POA: Insufficient documentation

## 2017-10-30 DIAGNOSIS — Z113 Encounter for screening for infections with a predominantly sexual mode of transmission: Secondary | ICD-10-CM | POA: Diagnosis not present

## 2017-10-30 DIAGNOSIS — Z79899 Other long term (current) drug therapy: Secondary | ICD-10-CM | POA: Diagnosis not present

## 2017-10-30 MED ORDER — METRONIDAZOLE 500 MG PO TABS: 2000.0000 mg | ORAL_TABLET | Freq: Once | ORAL | 0 refills | Status: AC

## 2017-10-30 NOTE — ED Provider Notes (Signed)
[facility]    CSN: [id number] Arrival date & time: 10/30/17  1554     History   Chief Complaint Chief Complaint  Patient presents with  . Exposure to STD    HPI Arkin Imran is a 42 y.o. male.   42 year old male comes in for STD testing.  States that his partner was tested positive for trichomonas.  He is asymptomatic.  Denies fever, chills, night sweats.  Denies abdominal pain, nausea, vomiting.  Denies urinary symptoms such as frequency, dysuria, hematuria.  Denies penile discharge, penile lesion, testicular swelling, testicular pain.  He is sexually active with one male partner, no condom use.     Past Medical History:  Diagnosis Date  . Allergy     Patient Active Problem List   Diagnosis Date Noted  . BMI 40.0-44.9, adult (HCC) 05/12/2015    History reviewed. No pertinent surgical history.     Home Medications    Prior to Admission medications   Medication Sig Start Date End Date Taking? Authorizing Provider  gabapentin (NEURONTIN) 300 MG capsule Take 1 capsule (300 mg total) by mouth 3 (three) times daily as needed. For tingling, numbness or burning pain 08/17/17   Myles Lipps, MD  ibuprofen (ADVIL,MOTRIN) 800 MG tablet Take 1 tablet (800 mg total) by mouth every 8 (eight) hours as needed. 08/17/17   Myles Lipps, MD  methocarbamol (ROBAXIN) 750 MG tablet Take 1 tablet (750 mg total) by mouth 4 (four) times daily. 08/25/17   Myles Lipps, MD  metroNIDAZOLE (FLAGYL) 500 MG tablet Take 4 tablets (2,000 mg total) by mouth once for 1 dose. 10/30/17 10/30/17  Belinda Fisher, PA-C    Family History Family History  Problem Relation Age of Onset  . Mental illness Mother   . Heart disease Maternal Grandmother     Social History Social History   Tobacco Use  . Smoking status: Never Smoker  . Smokeless tobacco: Never Used  Substance Use Topics  . Alcohol use: No  . Drug use: No     Allergies   Patient has no known allergies.   Review  of Systems Review of Systems  Reason unable to perform ROS: See HPI as above.     Physical Exam Triage Vital Signs ED Triage Vitals  Enc Vitals Group     BP 10/30/17 1632 (!) 160/63     Pulse Rate 10/30/17 1632 77     Resp 10/30/17 1632 18     Temp 10/30/17 1632 98.6 F (37 C)     Temp Source 10/30/17 1632 Oral     SpO2 10/30/17 1632 97 %     Weight --      Height --      Head Circumference --      Peak Flow --      Pain Score 10/30/17 1652 0     Pain Loc --      Pain Edu? --      Excl. in GC? --    No data found.  Updated Vital Signs BP (!) 160/63 (BP Location: Left Arm)   Pulse 77   Temp 98.6 F (37 C) (Oral)   Resp 18   SpO2 97%   Physical Exam  Constitutional: He is oriented to person, place, and time. He appears well-developed and well-nourished. No distress.  HENT:  Head: Normocephalic and atraumatic.  Eyes: Pupils are equal, round, and reactive to light. Conjunctivae are normal.  Neurological: He is alert and  oriented to person, place, and time.  Skin: He is not diaphoretic.     UC Treatments / Results  Labs (all labs ordered are listed, but only abnormal results are displayed) Labs Reviewed  URINE CYTOLOGY ANCILLARY ONLY    EKG None  Radiology No results found.  Procedures Procedures (including critical care time)  Medications Ordered in UC Medications - No data to display  Initial Impression / Assessment and Plan / UC Course  I have reviewed the triage vital signs and the nursing notes.  Pertinent labs & imaging results that were available during my care of the patient were reviewed by me and considered in my medical decision making (see chart for details).    Patient was treated empirically for trich. Flagyl as directed. Cytology sent, patient will be contacted with any positive results that require additional treatment. Patient to refrain from sexual activity for the next 7 days. Return precautions given.   Final Clinical  Impressions(s) / UC Diagnoses   Final diagnoses:  Exposure to STD    ED Prescriptions    Medication Sig Dispense Auth. Provider   metroNIDAZOLE (FLAGYL) 500 MG tablet Take 4 tablets (2,000 mg total) by mouth once for 1 dose. 4 tablet Threasa AlphaYu, Georgette Helmer V, PA-C        Suzan Manon V, [place]PA-C 10/30/17 1713

## 2017-10-30 NOTE — Discharge Instructions (Signed)
You were treated empirically for trichomonas. Start flagyl as directed. Cytology sent, you will be contacted with any positive results that requires further treatment. Refrain from sexual activity and alcohol use for the next 7 days. Monitor for any worsening of symptoms, fever, abdominal pain, nausea, vomiting, lesion/ulcer/sore on penis, testicular pain/swelling, to follow up for reevaluation.

## 2017-10-30 NOTE — ED Triage Notes (Signed)
Pt here for STD testing.

## 2017-10-31 ENCOUNTER — Telehealth (HOSPITAL_COMMUNITY): Payer: Self-pay

## 2017-10-31 LAB — URINE CYTOLOGY ANCILLARY ONLY
CHLAMYDIA, DNA PROBE: NEGATIVE
Neisseria Gonorrhea: NEGATIVE
Trichomonas: POSITIVE — AB

## 2017-10-31 NOTE — Telephone Encounter (Signed)
Trichomonas is positive. Rx metronidazole was given at the urgent care visit. Pt contacted and made aware, educated to please refrain from sexual intercourse for 7 days to give the medicine time to work. Sexual partners need to be notified and tested/treated. Condoms may reduce risk of reinfection. Recheck for further evaluation if symptoms are not improving. Answered all questions. 

## 2017-12-20 ENCOUNTER — Telehealth (HOSPITAL_COMMUNITY): Payer: Self-pay | Admitting: Emergency Medicine

## 2017-12-20 MED ORDER — METRONIDAZOLE 500 MG PO TABS: 500.0000 mg | ORAL_TABLET | Freq: Two times a day (BID) | ORAL | 0 refills | Status: DC

## 2018-01-18 ENCOUNTER — Ambulatory Visit (HOSPITAL_COMMUNITY)
Admission: EM | Admit: 2018-01-18 | Discharge: 2018-01-18 | Disposition: A | Discharge status: Home / Self Care | Payer: 59 | Attending: Family Medicine | Admitting: Family Medicine

## 2018-01-18 ENCOUNTER — Other Ambulatory Visit: Payer: Self-pay

## 2018-01-18 DIAGNOSIS — Z113 Encounter for screening for infections with a predominantly sexual mode of transmission: Secondary | ICD-10-CM | POA: Diagnosis not present

## 2018-01-18 DIAGNOSIS — Z79899 Other long term (current) drug therapy: Secondary | ICD-10-CM | POA: Insufficient documentation

## 2018-01-18 DIAGNOSIS — Z791 Long term (current) use of non-steroidal anti-inflammatories (NSAID): Secondary | ICD-10-CM | POA: Diagnosis not present

## 2018-01-18 NOTE — ED Triage Notes (Signed)
Pt states he wants to be retested for STD..Marland Kitchen

## 2018-01-18 NOTE — Discharge Instructions (Signed)
Declines treatment for gonorrhea and chlamydia today Urine cytology obtained Declines HIV/ syphilis testing today We will follow up with you regarding the results of your test If tests are positive, please abstain from sexual activity for at least 7 days and notify partners Follow up with PCP or [facility] if symptoms persists Return here or go to ER if you have any new or worsening symptoms

## 2018-01-18 NOTE — ED Provider Notes (Signed)
[facility]   [id number] 01/18/18 Arrival Time: 1232   WU:[id number]:CONCERN FOR STD  SUBJECTIVE:  Caleb Frederick is a 42 y.o. male who presents requesting STI screening.  Last unprotected sexual encounter end of July.  Sexually active with 1 male partner.  Partner with vaginal discharge.  Currently asymptomatic.  Denies fever, chills, nausea, vomiting, abdominal pain, urinary symptoms, urethral discharge, testicular pain or swelling, penile rashes or lesions.   No LMP for male patient.  ROS: As per HPI.  Past Medical History:  Diagnosis Date  . Allergy    No past surgical history on file. No Known Allergies No current facility-administered medications on file prior to encounter.    Current Outpatient Medications on File Prior to Encounter  Medication Sig Dispense Refill  . gabapentin (NEURONTIN) 300 MG capsule Take 1 capsule (300 mg total) by mouth 3 (three) times daily as needed. For tingling, numbness or burning pain 30 capsule 0  . ibuprofen (ADVIL,MOTRIN) 800 MG tablet Take 1 tablet (800 mg total) by mouth every 8 (eight) hours as needed. 30 tablet 0  . methocarbamol (ROBAXIN) 750 MG tablet Take 1 tablet (750 mg total) by mouth 4 (four) times daily. 60 tablet 0  . metroNIDAZOLE (FLAGYL) 500 MG tablet Take 1 tablet (500 mg total) by mouth 2 (two) times daily. 14 tablet 0   Social History   Socioeconomic History  . Marital status: Single    Spouse name: Not on file  . Number of children: Not on file  . Years of education: Not on file  . Highest education level: Not on file  Occupational History  . Not on file  Social Needs  . Financial resource strain: Not on file  . Food insecurity:    Worry: Not on file    Inability: Not on file  . Transportation needs:    Medical: Not on file    Non-medical: Not on file  Tobacco Use  . Smoking status: Never Smoker  . Smokeless tobacco: Never Used  Substance and Sexual Activity  . Alcohol use: No  . Drug use: No  . Sexual  activity: Not on file  Lifestyle  . Physical activity:    Days per week: Not on file    Minutes per session: Not on file  . Stress: Not on file  Relationships  . Social connections:    Talks on phone: Not on file    Gets together: Not on file    Attends religious service: Not on file    Active member of club or organization: Not on file    Attends meetings of clubs or organizations: Not on file    Relationship status: Not on file  . Intimate partner violence:    Fear of current or ex partner: Not on file    Emotionally abused: Not on file    Physically abused: Not on file    Forced sexual activity: Not on file  Other Topics Concern  . Not on file  Social History Narrative  . Not on file   Family History  Problem Relation Age of Onset  . Mental illness Mother   . Heart disease Maternal Grandmother     OBJECTIVE:  Vitals:   01/18/18 1250 01/18/18 1251  BP: 121/74   Pulse: 86   Resp: 18   Temp: 98.1 F (36.7 C)   TempSrc: Oral   SpO2: 100%   Weight:  284 lb (128.8 kg)     General appearance: alert, cooperative, appears  stated age and no distress Throat: lips, mucosa, and tongue normal; teeth and gums normal Lungs: CTA bilaterally without adventitious breath sounds Heart: regular rate and rhythm.  Radial pulses 2+ symmetrical bilaterally Abdomen: soft, non-tender; bowel sounds normal; no guarding GU: deferred Skin: warm and dry Psychological:  Alert and cooperative. Normal mood and affect.  Results for orders placed or performed during the hospital encounter of 10/30/17  Urine cytology ancillary only  Result Value Ref Range   Chlamydia Negative    Neisseria gonorrhea Negative    Trichomonas **POSITIVE** (A)     Labs Reviewed  URINE CYTOLOGY ANCILLARY ONLY    ASSESSMENT & PLAN:  1. Screen for STD (sexually transmitted disease)     No orders of the defined types were placed in this encounter.   Pending: Labs Reviewed  URINE CYTOLOGY ANCILLARY ONLY      Declines treatment for gonorrhea and chlamydia today Urine cytology obtained Declines HIV/ syphilis testing today We will follow up with you regarding the results of your test If tests are positive, please abstain from sexual activity for at least 7 days and notify partners Follow up with PCP or [facility] if symptoms persists Return here or go to ER if you have any new or worsening symptoms    Reviewed expectations re: course of current medical issues. Questions answered. Outlined signs and symptoms indicating need for more acute intervention. Patient verbalized understanding. After Visit Summary given.       Rennis Harding, PA-C 01/18/18 1313

## 2018-01-19 LAB — URINE CYTOLOGY ANCILLARY ONLY
Chlamydia: NEGATIVE
NEISSERIA GONORRHEA: NEGATIVE
Trichomonas: NEGATIVE

## 2018-04-26 ENCOUNTER — Ambulatory Visit (INDEPENDENT_AMBULATORY_CARE_PROVIDER_SITE_OTHER): Payer: 59

## 2018-04-26 ENCOUNTER — Ambulatory Visit (HOSPITAL_COMMUNITY)
Admission: EM | Admit: 2018-04-26 | Discharge: 2018-04-26 | Disposition: A | Discharge status: Home / Self Care | Payer: 59 | Attending: Family Medicine | Admitting: Family Medicine

## 2018-04-26 ENCOUNTER — Encounter (HOSPITAL_COMMUNITY): Payer: Self-pay | Admitting: Emergency Medicine

## 2018-04-26 DIAGNOSIS — R05 Cough: Secondary | ICD-10-CM | POA: Insufficient documentation

## 2018-04-26 DIAGNOSIS — R059 Cough, unspecified: Secondary | ICD-10-CM

## 2018-04-26 MED ORDER — PREDNISONE 10 MG (21) PO TBPK: ORAL_TABLET | Freq: Every day | ORAL | 0 refills | Status: DC

## 2018-04-26 NOTE — ED Triage Notes (Signed)
[facility], states its so severe when he coughs he vomits.  States he coughs very hard when he tries to talk and it tickles his throat.  [facility] and cough syrup.  States he is not improving, and only has a few doses of abx left.

## 2018-04-28 NOTE — ED Provider Notes (Signed)
[facility]   [id number] 04/26/18 Arrival Time: 1453  ASSESSMENT & PLAN:  1. Cough    Questions component of bronchospasm.  Meds ordered this encounter  Medications  . predniSONE (STERAPRED UNI-PAK 21 TAB) 10 MG (21) TBPK tablet    Sig: Take by mouth daily. Take as directed.    Dispense:  21 tablet    Refill:  0   Labs Reviewed - No data to display  OTC medications as needed.  Instructed to finish full 10 day course of antibiotics since he has started them. Discussed that after certain illnesses the cough may persist for a few weeks. No signs of pneumonia. No indication for chest imaging at this time.  Follow-up Information    [facility].   Specialty:  Urgent Care Why:  If symptoms worsen. Contact information: [address] [place] [id number] [phone]         Reviewed expectations re: course of current medical issues. Questions answered. Outlined signs and symptoms indicating need for more acute intervention. Patient verbalized understanding. After Visit Summary given.   SUBJECTIVE:  Caleb Frederick is a 42 y.o. male who reports a persistent cough with occasional post-tussive emesis. Present over the past 2 weeks; initial cold symptoms that have resolved. Mild ST; "feel a tickle in my throat". Placed on amoxicillin and cough medication. "Not much help." No SOB. Questions wheezing at times. Cough is affecting sleep. Fever reported: no. No associated n/v/abdominal symptoms. Sick contacts: none known. No chest or back pain.  Social History   Tobacco Use  Smoking Status Never Smoker  Smokeless Tobacco Never Used   OTC treatment: none reported.  ROS: As per HPI. All other systems negative.    OBJECTIVE:  Vitals:   04/26/18 1544  BP: 123/77  Pulse: 75  Resp: 20  Temp: 98.4 F (36.9 C)  TempSrc: Oral  SpO2: 100%    General appearance: alert; no distress HEENT: throat with mild  cobblestoning; uvula midline; mild nasal congestion Neck: supple with FROM; no lymphadenopathy CV: RRR without murmer Lungs: clear to auscultation bilaterally; initially I thought I could hear some expiratory wheezing but this cleared with a deep breath; unlabored respirations Ext: no LE edema Skin: reveals no rash; warm and dry Psychological: alert and cooperative; normal mood and affect  No Known Allergies  Past Medical History:  Diagnosis Date  . Allergy    Social History   Socioeconomic History  . Marital status: Single    Spouse name: Not on file  . Number of children: Not on file  . Years of education: Not on file  . Highest education level: Not on file  Occupational History  . Not on file  Social Needs  . Financial resource strain: Not on file  . Food insecurity:    Worry: Not on file    Inability: Not on file  . Transportation needs:    Medical: Not on file    Non-medical: Not on file  Tobacco Use  . Smoking status: Never Smoker  . Smokeless tobacco: Never Used  Substance and Sexual Activity  . Alcohol use: No  . Drug use: No  . Sexual activity: Not on file  Lifestyle  . Physical activity:    Days per week: Not on file    Minutes per session: Not on file  . Stress: Not on file  Relationships  . Social connections:    Talks on phone: Not on file  Gets together: Not on file    Attends religious service: Not on file    Active member of club or organization: Not on file    Attends meetings of clubs or organizations: Not on file    Relationship status: Not on file  . Intimate partner violence:    Fear of current or ex partner: Not on file    Emotionally abused: Not on file    Physically abused: Not on file    Forced sexual activity: Not on file  Other Topics Concern  . Not on file  Social History Narrative  . Not on file   Family History  Problem Relation Age of Onset  . Mental illness Mother   . Heart disease Maternal Dulce SellarGrandmother             Kuzey Ogata, MD 05/01/18 [phone]

## 2018-05-08 ENCOUNTER — Encounter (HOSPITAL_COMMUNITY): Payer: Self-pay | Admitting: Emergency Medicine

## 2018-05-08 ENCOUNTER — Ambulatory Visit (HOSPITAL_COMMUNITY)
Admission: EM | Admit: 2018-05-08 | Discharge: 2018-05-08 | Disposition: A | Discharge status: Home / Self Care | Payer: 59 | Attending: Emergency Medicine | Admitting: Emergency Medicine

## 2018-05-08 ENCOUNTER — Other Ambulatory Visit: Payer: Self-pay

## 2018-05-08 DIAGNOSIS — R0982 Postnasal drip: Secondary | ICD-10-CM

## 2018-05-08 DIAGNOSIS — R059 Cough, unspecified: Secondary | ICD-10-CM

## 2018-05-08 DIAGNOSIS — R05 Cough: Secondary | ICD-10-CM | POA: Diagnosis not present

## 2018-05-08 MED ORDER — FAMOTIDINE 20 MG PO TABS: 20.0000 mg | ORAL_TABLET | Freq: Two times a day (BID) | ORAL | 0 refills | Status: DC

## 2018-05-08 MED ORDER — FLUTICASONE PROPIONATE 50 MCG/ACT NA SUSP: 2.0000 | Freq: Every day | NASAL | 0 refills | Status: DC

## 2018-05-08 MED ORDER — HYDROCOD POLST-CPM POLST ER 10-8 MG/5ML PO SUER: 5.0000 mL | Freq: Two times a day (BID) | ORAL | 0 refills | Status: DC | PRN

## 2018-05-08 MED ORDER — PANTOPRAZOLE SODIUM 20 MG PO TBEC: 20.0000 mg | DELAYED_RELEASE_TABLET | Freq: Every day | ORAL | 0 refills | Status: DC

## 2018-05-08 MED ORDER — BENZONATATE 200 MG PO CAPS: 200.0000 mg | ORAL_CAPSULE | Freq: Three times a day (TID) | ORAL | 0 refills | Status: DC | PRN

## 2018-05-08 NOTE — ED Triage Notes (Signed)
PT reports a cough for a month. PT reports it takes his breath away when he has a coughing fit. Was seen here 12/26 for same

## 2018-05-08 NOTE — Discharge Instructions (Addendum)
Try the Flonase to help with the postnasal drip.  The Protonix and Pepcid will help with any possible acid reflux.  The Pepcid will start working right away, the Protonix will take 3 days for it to start working.  Tessalon for the cough during the day, Tussionex for the cough at night.  Follow-up with [facility] primary care or with a primary care provider of your choice, see list below  Below is a list of primary care practices who are taking new patients for you to follow-up with. [facility] [address] [place], [place] [address] [phone]  [facility] [phone] [address] [place] [address]  [facility]: [address] [place] [address]  [phone]  [facility]: [address] [place] [address]   [phone]  [facility]: [address] [place] [address]  [phone]  [facility] : [address] [place] [address] [phone]  [facility]: [address] [place] [address] [phone]  [facility]: [address] [place] [address] [phone]  Dr. Oneal Grout [address] [facility] [place] [address]  [phone]  Dr. Jackie Plum, [facility]. [address] [place], [place] [address]  [phone]  Go to www.goodrx.com to look up your medications. This will give you a list of where you can find your prescriptions at the most affordable prices. Or ask the pharmacist what the cash price is, or if they have any other discount programs available to help make your medication more affordable. This can be less expensive than what you would pay with insurance.

## 2018-05-08 NOTE — ED Provider Notes (Signed)
HPI  SUBJECTIVE:  Caleb Frederick is a 43 y.o. male who presents with 1 month of a cough.  States that it is occasionally productive of white phlegm.  He reports coughing fits, hoarseness after coughing, and shortness of breath with coughing only.  States that he "feels great" but he is concerned because it is not going away.  He is able to exercise without any problems.  No fevers, wheezing, chest pain, posttussive emesis, dyspnea on exertion.  He reports burning in the back of his throat which she states triggers off his cough.  He reports nasal congestion and postnasal drip starting today.  No sinus pain or pressure.  No allergy symptoms, belching.  He states that he is waking up at night coughing.  He tried Prilosec for 4 days, prednisone, Delsym, Alka-Seltzer cold and sinus, Promethazine DM, Augmentin.  No alleviating factors.  Symptoms are worse when his throat is dry and with talking.  Denies lower extremity edema, unintentional weight gain, PND, nocturia, orthopnea, abdominal pain.  He has a questionable history of GERD but is not taking anything on a regular basis for this.  No history of hypertension, CHF, asthma, emphysema, COPD, smoking.  PMD: None.  [facility] prior to the urgent care visit on 12/17, thought to have sinusitis and was placed on 7 days of Augmentin, Promethazine DM. cough was thought to be from postnasal drip.  Patient was evaluated here on 12/26 for persistent cough, thought to have a nonspecific cough with a questionable component of bronchospasm.  Chest x-ray was normal, he was sent home with prednisone 6-day taper.  He states that none of this really helped.  Past Medical History:  Diagnosis Date  . Allergy     History reviewed. No pertinent surgical history.  Family History  Problem Relation Age of Onset  . Mental illness Mother   . Heart disease Maternal Grandmother     Social History   Tobacco Use  . Smoking status: Never  Smoker  . Smokeless tobacco: Never Used  Substance Use Topics  . Alcohol use: No  . Drug use: No    No current facility-administered medications for this encounter.   Current Outpatient Medications:  .  benzonatate (TESSALON) 200 MG capsule, Take 1 capsule (200 mg total) by mouth 3 (three) times daily as needed for cough., Disp: 30 capsule, Rfl: 0 .  chlorpheniramine-HYDROcodone (TUSSIONEX PENNKINETIC ER) 10-8 MG/5ML SUER, Take 5 mLs by mouth every 12 (twelve) hours as needed for cough., Disp: 60 mL, Rfl: 0 .  famotidine (PEPCID) 20 MG tablet, Take 1 tablet (20 mg total) by mouth 2 (two) times daily., Disp: 40 tablet, Rfl: 0 .  fluticasone (FLONASE) 50 MCG/ACT nasal spray, Place 2 sprays into both nostrils daily., Disp: 16 g, Rfl: 0 .  pantoprazole (PROTONIX) 20 MG tablet, Take 1 tablet (20 mg total) by mouth daily., Disp: 30 tablet, Rfl: 0  No Known Allergies   ROS  As noted in HPI.   Physical Exam  BP 119/81   Pulse 71   Temp 97.8 F (36.6 C) (Temporal)   Resp 16   SpO2 100%   Constitutional: Well developed, well nourished, no acute distress Eyes:  EOMI, conjunctiva normal bilaterally HENT: Normocephalic, atraumatic,mucus membranes moist.  No nasal congestion.  Normal turbinates.  No frontal or maxillary sinus tenderness.  Positive postnasal drip. Respiratory: Normal inspiratory effort, lungs clear bilaterally, good air movement.  No chest wall tenderness Cardiovascular: Normal rate,  regular rhythm, no murmurs, rubs, gallops. GI: nondistended skin: No rash, skin intact Musculoskeletal: no deformities, calves symmetric, nontender, no edema. Neurologic: Alert & oriented x 3, no focal neuro deficits Psychiatric: Speech and behavior appropriate   ED Course   Medications - No data to display  No orders of the defined types were placed in this encounter.   No results found for this or any previous visit (from the past 24 hour(s)). No results found.  ED Clinical  Impression  Cough  Post-nasal drip   ED Assessment/Plan  Previous records from this clinic, radiology report, and outside records reviewed.  As noted in HPI.  Suspect that his cough is coming from postnasal drip versus acid reflux.  He had a negative chest x-ray at his last visit, states that he "feels great", has no decreased exercise tolerance so doubt pneumonia, CHF.  Normal vitals, doubt PE.  Deferring chest x-ray today absence of fevers, abnormal vital signs.  Will try treating postnasal drip with Flonase, some supportive treatment with Tessalon and Tussionex.  We will also start him on Protonix and Pepcid.  He will follow-up with the [facility] clinic.  [place] Narcotic database reviewed for this patient, and feel that the risk/benefit ratio today is favorable for proceeding with a prescription for controlled substance.  Last opiate prescription was in August 2019.  Discussed  MDM, treatment plan, and plan for follow-up with patient.  patient agrees with plan.   Meds ordered this encounter  Medications  . pantoprazole (PROTONIX) 20 MG tablet    Sig: Take 1 tablet (20 mg total) by mouth daily.    Dispense:  30 tablet    Refill:  0  . famotidine (PEPCID) 20 MG tablet    Sig: Take 1 tablet (20 mg total) by mouth 2 (two) times daily.    Dispense:  40 tablet    Refill:  0  . fluticasone (FLONASE) 50 MCG/ACT nasal spray    Sig: Place 2 sprays into both nostrils daily.    Dispense:  16 g    Refill:  0  . benzonatate (TESSALON) 200 MG capsule    Sig: Take 1 capsule (200 mg total) by mouth 3 (three) times daily as needed for cough.    Dispense:  30 capsule    Refill:  0  . chlorpheniramine-HYDROcodone (TUSSIONEX PENNKINETIC ER) 10-8 MG/5ML SUER    Sig: Take 5 mLs by mouth every 12 (twelve) hours as needed for cough.    Dispense:  60 mL    Refill:  0    *This clinic note was created using Scientist, clinical (histocompatibility and immunogenetics). Therefore, there may be occasional mistakes despite careful  proofreading.   ?    Domenick Gong, MD 05/09/18 1234

## 2018-05-22 ENCOUNTER — Ambulatory Visit
Admission: EM | Admit: 2018-05-22 | Discharge: 2018-05-22 | Disposition: A | Discharge status: Home / Self Care | Payer: 59 | Attending: Emergency Medicine | Admitting: Emergency Medicine

## 2018-05-22 ENCOUNTER — Encounter: Payer: Self-pay | Admitting: Emergency Medicine

## 2018-05-22 ENCOUNTER — Ambulatory Visit: Payer: 59

## 2018-05-22 DIAGNOSIS — R05 Cough: Secondary | ICD-10-CM | POA: Diagnosis not present

## 2018-05-22 DIAGNOSIS — R059 Cough, unspecified: Secondary | ICD-10-CM

## 2018-05-22 MED ORDER — AEROCHAMBER PLUS MISC: 2 refills | Status: DC

## 2018-05-22 MED ORDER — ALBUTEROL SULFATE HFA 108 (90 BASE) MCG/ACT IN AERS: 1.0000 | INHALATION_SPRAY | Freq: Four times a day (QID) | RESPIRATORY_TRACT | 0 refills | Status: DC | PRN

## 2018-05-22 MED ORDER — AZITHROMYCIN 250 MG PO TABS: 250.0000 mg | ORAL_TABLET | Freq: Every day | ORAL | 0 refills | Status: DC

## 2018-05-22 MED ORDER — PREDNISONE 50 MG PO TABS: 50.0000 mg | ORAL_TABLET | Freq: Every day | ORAL | 0 refills | Status: DC

## 2018-05-22 NOTE — Discharge Instructions (Addendum)
Continue the Flonase, restart your Zyrtec.  2 puffs from your albuterol inhaler using your spacer every 4-6 hours as needed for coughing.  Try the prednisone.  I am also giving you antibiotics although there was no pneumonia on your x-ray.  Chest x-ray was normal.  Follow-up with a pulmonologist if you are not better in a week to 10 days.  Follow-up with your primary care physician as scheduled.  Go immediately to the ER if you get worse, or for any other concerns.

## 2018-05-22 NOTE — ED Notes (Signed)
Patient able to ambulate independently  

## 2018-05-22 NOTE — ED Triage Notes (Signed)
Pt presents to [facility] for assessment of severe cough x 1 month, multiple visits and treatments without relief.  Pt states he coughs so hard he vomits.  States he gets SOB when he coughs.  Patient states no other related symptoms except cough and "chest congestion".

## 2018-05-22 NOTE — ED Provider Notes (Signed)
HPI  SUBJECTIVE:  Caleb Frederick is a 43 y.o. male who presents with 6 weeks of a persistent nonproductive cough with occasional posttussive emesis.  He reports shortness of breath with coughing only.  He reports mild chest congestion.  He states that he otherwise "feels great".  No nasal congestion, postnasal drip, sinus pain or pressure, fevers.  He reports wheezing when he is coughing and trying to catch his breath, denies wheezing at any other time.  No chest pain, unintentional weight loss, unintentional weight gain, nocturia, lower extremity edema, orthopnea, abdominal pain, PND.  No allergy or GERD symptoms.  No calf pain, swelling, surgery in the past 4 weeks, hemoptysis, recent immobilization or travel. Patient initially treated for sinusitis. His cough was thought to be due from postnasal drip, and [facility]. the first time he had a negative chest x-ray home with prednisone, and then again 2 weeks ago, where he was sent home with Tessalon, Tussionex, Flonase, Pepcid and Protonix.  Chest x-ray not done on the second visit due to negative chest x-ray on initial visit and he had had no fevers, lungs were clear.  Patient has a past medical history of seasonal allergies for which he takes Zyrtec from March until July, no history of DVT, PE, cancer, asthma, emphysema, COPD, smoking, vaping, CHF, hypertension.  [facility].   Past Medical History:  Diagnosis Date  . Allergy     History reviewed. No pertinent surgical history.  Family History  Problem Relation Age of Onset  . Mental illness Mother   . Heart disease Maternal Grandmother     Social History   Tobacco Use  . Smoking status: Never Smoker  . Smokeless tobacco: Never Used  Substance Use Topics  . Alcohol use: No  . Drug use: No    No current facility-administered medications for this encounter.   Current Outpatient  Medications:  .  famotidine (PEPCID) 20 MG tablet, Take 1 tablet (20 mg total) by mouth 2 (two) times daily., Disp: 40 tablet, Rfl: 0 .  fluticasone (FLONASE) 50 MCG/ACT nasal spray, Place 2 sprays into both nostrils daily., Disp: 16 g, Rfl: 0 .  pantoprazole (PROTONIX) 20 MG tablet, Take 1 tablet (20 mg total) by mouth daily., Disp: 30 tablet, Rfl: 0 .  albuterol (PROVENTIL HFA;VENTOLIN HFA) 108 (90 Base) MCG/ACT inhaler, Inhale 1-2 puffs into the lungs every 6 (six) hours as needed for wheezing or shortness of breath., Disp: 1 Inhaler, Rfl: 0 .  azithromycin (ZITHROMAX) 250 MG tablet, Take 1 tablet (250 mg total) by mouth daily. 2 tabs po on day 1, 1 tab po on days 2-5, Disp: 6 tablet, Rfl: 0 .  predniSONE (DELTASONE) 50 MG tablet, Take 1 tablet (50 mg total) by mouth daily with breakfast., Disp: 5 tablet, Rfl: 0 .  Spacer/Aero-Holding Chambers (AEROCHAMBER PLUS) inhaler, Use as instructed, Disp: 1 each, Rfl: 2  No Known Allergies   ROS  As noted in HPI.   Physical Exam  BP (!) 145/77 (BP Location: Right Arm)   Pulse 72   Temp (!) 97.4 F (36.3 C) (Oral)   Resp 18   SpO2 97%   Constitutional: Well developed, well nourished, no acute distress Eyes:  EOMI, conjunctiva normal bilaterally HENT: Normocephalic, atraumatic,mucus membranes moist.  No nasal congestion.  Normal turbinates.  No sinus tenderness.  Normal oropharynx without postnasal drip. Respiratory: Normal inspiratory effort  lungs clear bilaterally, good air movement.  No chest wall tenderness Cardiovascular: Normal rate, regular rhythm, no murmurs, rubs, gallops GI: nondistended skin: No rash, skin intact Musculoskeletal: no deformities, no edema, calves symmetric, nontender. Neurologic: Alert & oriented x 3, no focal neuro deficits Psychiatric: Speech and behavior appropriate   ED Course   Medications - No data to display  Orders Placed This Encounter  Procedures  . DG Chest 2 View    Standing Status:    Standing    Number of Occurrences:   1    Order Specific Question:   Reason for Exam (SYMPTOM  OR DIAGNOSIS REQUIRED)    Answer:   cough, congestion x 1 month    No results found for this or any previous visit (from the past 24 hour(s)). Dg Chest 2 View  Result Date: 05/22/2018 CLINICAL DATA:  Persistent cough and congestion 6 weeks. EXAM: CHEST - 2 VIEW COMPARISON:  None. FINDINGS: Cardiomediastinal silhouette is normal. Mediastinal contours appear intact. There is no evidence of focal airspace consolidation, pleural effusion or pneumothorax. Osseous structures are without acute abnormality. Soft tissues are grossly normal. IMPRESSION: No active cardiopulmonary disease. Electronically Signed   By: Ted Mcalpine M.D.   On: 05/22/2018 16:04    ED Clinical Impression  Cough   ED Assessment/Plan  Previous records reviewed.  As noted in HPI.  Reviewed imaging independently.  Normal x-ray.  See radiology report for full details.  Patient is PERC negative.  Is not hypoxic, tachycardic.  Doubt PE.  Wonder if the patient does not have some sort of bronchospasm, as he has already been treated for GERD, postnasal drip, sinusitis.  If chest x-ray is negative, will send home with an albuterol inhaler with a spacer, restart Zyrtec, 5 days 50 mg pf prednisone and azithromycin Z-Pak because of the duration of symptoms.  We will have him follow-up with his primary care physician in [place] as scheduled on 1/28.  Also referring him to [facility] pulmonology.  Discussed imaging, MDM, treatment plan, and plan for follow-up with patient. Discussed sn/sx that should prompt return to the ED. patient agrees with plan.   Meds ordered this encounter  Medications  . albuterol (PROVENTIL HFA;VENTOLIN HFA) 108 (90 Base) MCG/ACT inhaler    Sig: Inhale 1-2 puffs into the lungs every 6 (six) hours as needed for wheezing or shortness of breath.    Dispense:  1 Inhaler    Refill:  0  . predniSONE (DELTASONE) 50  MG tablet    Sig: Take 1 tablet (50 mg total) by mouth daily with breakfast.    Dispense:  5 tablet    Refill:  0  . Spacer/Aero-Holding Chambers (AEROCHAMBER PLUS) inhaler    Sig: Use as instructed    Dispense:  1 each    Refill:  2  . azithromycin (ZITHROMAX) 250 MG tablet    Sig: Take 1 tablet (250 mg total) by mouth daily. 2 tabs po on day 1, 1 tab po on days 2-5    Dispense:  6 tablet    Refill:  0    *This clinic note was created using Scientist, clinical (histocompatibility and immunogenetics). Therefore, there may be occasional mistakes despite careful proofreading.   ?   Domenick Gong, MD 05/22/18 1626

## 2018-05-28 ENCOUNTER — Encounter: Payer: Self-pay | Admitting: Family Medicine

## 2018-05-29 ENCOUNTER — Ambulatory Visit: Payer: 59 | Admitting: Family Medicine

## 2018-06-04 ENCOUNTER — Encounter

## 2018-06-04 ENCOUNTER — Encounter: Payer: Self-pay | Admitting: Family Medicine

## 2018-06-04 ENCOUNTER — Ambulatory Visit (INDEPENDENT_AMBULATORY_CARE_PROVIDER_SITE_OTHER): Payer: 59 | Admitting: Family Medicine

## 2018-06-04 VITALS — BP 105/72 | HR 88 | Temp 98.1°F | Resp 17 | Ht 70.0 in | Wt 280.4 lb

## 2018-06-04 DIAGNOSIS — K2100 Gastro-esophageal reflux disease with esophagitis, without bleeding: Secondary | ICD-10-CM

## 2018-06-04 DIAGNOSIS — R05 Cough: Secondary | ICD-10-CM

## 2018-06-04 DIAGNOSIS — Z7689 Persons encountering health services in other specified circumstances: Secondary | ICD-10-CM

## 2018-06-04 DIAGNOSIS — R059 Cough, unspecified: Secondary | ICD-10-CM

## 2018-06-04 MED ORDER — PROMETHAZINE HCL 6.25 MG/5ML PO SYRP: ORAL_SOLUTION | ORAL | 0 refills | Status: DC

## 2018-06-04 MED ORDER — OMEPRAZOLE 40 MG PO CPDR: 40.0000 mg | DELAYED_RELEASE_CAPSULE | Freq: Every day | ORAL | 3 refills | Status: DC

## 2018-06-04 NOTE — Progress Notes (Signed)
Patient ID: Caleb Frederick, male    DOB: 12/19/1975, 43 y.o.   MRN: [id number]  PCP: Bing NeighborsHarris, Kimberly S, FNP  Chief Complaint  Patient presents with  . Establish Care  . Follow-up    UC 1/7 & 1/21: cough    Subjective:  HPI  Caleb HagemanJermaine Kirst is a 43 y.o. male presents for evaluation to establish care and complaint of a persistent dry non productive cough. He endorses that cough has had minimal improvement despite previous treatment with tessalon pearls and 5 day prednisone dosing. He endorses that the cough is recurrent and worst at night. He endorse that he has received relief at night with previously prescribed promethazine syrup. He denies any fever, recent infection or exposure to any person with similar symptoms. He denies any chest pain, palpitations or shortness of breath. He endorses that he has been using over the counter prilosec for acid reflux. He also endorses a burning sensation in throat with coughing and when he consumes chocolate.   Social History   Socioeconomic History  . Marital status: Single    Spouse name: Not on file  . Number of children: 2  . Years of education: Not on file  . Highest education level: Not on file  Occupational History  . Not on file  Social Needs  . Financial resource strain: Not on file  . Food insecurity:    Worry: Not on file    Inability: Not on file  . Transportation needs:    Medical: Not on file    Non-medical: Not on file  Tobacco Use  . Smoking status: Never Smoker  . Smokeless tobacco: Never Used  Substance and Sexual Activity  . Alcohol use: No  . Drug use: No  . Sexual activity: Not on file  Lifestyle  . Physical activity:    Days per week: Not on file    Minutes per session: Not on file  . Stress: Not on file  Relationships  . Social connections:    Talks on phone: Not on file    Gets together: Not on file    Attends religious service: Not on file    Active member of club or organization: Not on file    Attends  meetings of clubs or organizations: Not on file    Relationship status: Not on file  . Intimate partner violence:    Fear of current or ex partner: Not on file    Emotionally abused: Not on file    Physically abused: Not on file    Forced sexual activity: Not on file  Other Topics Concern  . Not on file  Social History Narrative  . Not on file    Family History  Problem Relation Age of Onset  . Mental illness Mother   . Stroke Mother   . Heart disease Maternal Grandmother   . Hypertension Maternal Grandmother   . GER disease Maternal Aunt   . Cancer Neg Hx      Review of Systems  Constitutional: Negative for activity change and fever.  HENT: Negative for congestion, postnasal drip, rhinorrhea and trouble swallowing.        Burning pain with coughing  Respiratory: Positive for cough. Negative for chest tightness, shortness of breath and wheezing.   Cardiovascular: Negative for chest pain, palpitations and leg swelling.  Gastrointestinal: Positive for vomiting. Negative for abdominal pain.       Vomiting with excessive coughing episodes      Patient Active Problem List  Diagnosis Date Noted  . BMI 40.0-44.9, adult (HCC) 05/12/2015    No Known Allergies  Prior to Admission medications   Medication Sig Start Date End Date Taking? Authorizing Provider  albuterol (PROVENTIL HFA;VENTOLIN HFA) 108 (90 Base) MCG/ACT inhaler Inhale 1-2 puffs into the lungs every 6 (six) hours as needed for wheezing or shortness of breath. 05/22/18  Yes Domenick GongMortenson, Ashley, MD  famotidine (PEPCID) 20 MG tablet Take 1 tablet (20 mg total) by mouth 2 (two) times daily. 05/08/18  Yes Domenick GongMortenson, Ashley, MD  fluticasone (FLONASE) 50 MCG/ACT nasal spray Place 2 sprays into both nostrils daily. 05/08/18  Yes Domenick GongMortenson, Ashley, MD  pantoprazole (PROTONIX) 20 MG tablet Take 1 tablet (20 mg total) by mouth daily. 05/08/18  Yes Domenick GongMortenson, Ashley, MD  omeprazole (PRILOSEC) 40 MG capsule Take 1 capsule (40 mg total)  by mouth daily. 06/04/18   Bing NeighborsHarris, Kimberly S, FNP  promethazine (PHENERGAN) 6.25 MG/5ML syrup TAKE 5 ML AT BEDTIME AS NEEDED 06/04/18   Bing NeighborsHarris, Kimberly S, FNP    Past Medical, Surgical Family and Social History reviewed and updated.    Objective:   Today's Vitals   06/04/18 1142  BP: 105/72  Pulse: 88  Resp: 17  Temp: 98.1 F (36.7 C)  TempSrc: Oral  SpO2: 95%  Weight: 280 lb 6.4 oz (127.2 kg)  Height: 5\' 10"  (1.778 m)    Wt Readings from Last 3 Encounters:  06/04/18 280 lb 6.4 oz (127.2 kg)  01/18/18 284 lb (128.8 kg)  09/01/17 294 lb (133.4 kg)     Physical Exam Constitutional:      Appearance: Normal appearance.  HENT:     Head: Normocephalic.     Nose: No congestion.     Mouth/Throat:     Mouth: Mucous membranes are moist.     Pharynx: Oropharynx is clear. No oropharyngeal exudate or posterior oropharyngeal erythema.  Neck:     Musculoskeletal: Normal range of motion and neck supple.  Cardiovascular:     Rate and Rhythm: Normal rate.     Pulses: Normal pulses.     Heart sounds: Normal heart sounds.  Pulmonary:     Effort: Pulmonary effort is normal.     Breath sounds: Normal breath sounds.  Neurological:     Mental Status: He is alert and oriented to person, place, and time.     No results found for: POCGLU  No results found for: HGBA1C          Assessment & Plan:  1. Encounter to establish care Will follow up in 3 weeks  2. Cough Promethazine cough syrup prn hs  3. Gastroesophageal reflux disease with esophagitis Increase prilosec to 40 mg daily educated on foods to avoid that will worsen symptoms. Will follow up in 3 weeks if no resolution of symptoms will refer to gastroenterology.   If symptoms worsen or do not improve, return for follow-up, follow-up with PCP, or at the emergency department if severity of symptoms warrant a higher level of care.     Legrand Pittsiffany Tallie Dodds, FNP-S [facility] [address], [place]: [phone]   [phone]

## 2018-06-04 NOTE — Patient Instructions (Addendum)
Thank you for choosing Primary Care at [facility] to be your medical home!    Caleb Frederick was seen by Joaquin Courts, FNP today.   Winton Ballen's primary care provider is Bing Neighbors, FNP.   For the best care possible, you should try to see Joaquin Courts, FNP-C whenever you come to the clinic.   We look forward to seeing you again soon!  If you have any questions about your visit today, please call us at [phone] or feel free to reach your primary care provider via MyChart.     Please take the prilosec and promethazine as prescribed for cough  Gastroesophageal Reflux Disease, Adult Gastroesophageal reflux (GER) happens when acid from the stomach flows up into the tube that connects the mouth and the stomach (esophagus). Normally, food travels down the esophagus and stays in the stomach to be digested. With GER, food and stomach acid sometimes move back up into the esophagus. You may have a disease called gastroesophageal reflux disease (GERD) if the reflux:  Happens often.  Causes frequent or very bad symptoms.  Causes problems such as damage to the esophagus. When this happens, the esophagus becomes sore and swollen (inflamed). Over time, GERD can make small holes (ulcers) in the lining of the esophagus. What are the causes? This condition is caused by a problem with the muscle between the esophagus and the stomach. When this muscle is weak or not normal, it does not close properly to keep food and acid from coming back up from the stomach. The muscle can be weak because of:  Tobacco use.  Pregnancy.  Having a certain type of hernia (hiatal hernia).  Alcohol use.  Certain foods and drinks, such as coffee, chocolate, onions, and peppermint. What increases the risk? You are more likely to develop this condition if you:  Are overweight.  Have a disease that affects your connective tissue.  Use NSAID medicines. What are the signs or symptoms? Symptoms  of this condition include:  Heartburn.  Difficult or painful swallowing.  The feeling of having a lump in the throat.  A bitter taste in the mouth.  Bad breath.  Having a lot of saliva.  Having an upset or bloated stomach.  Belching.  Chest pain. Different conditions can cause chest pain. Make sure you see your doctor if you have chest pain.  Shortness of breath or noisy breathing (wheezing).  Ongoing (chronic) cough or a cough at night.  Wearing away of the surface of teeth (tooth enamel).  Weight loss. How is this treated? Treatment will depend on how bad your symptoms are. Your doctor may suggest:  Changes to your diet.  Medicine.  Surgery. Follow these instructions at home: Eating and drinking   Follow a diet as told by your doctor. You may need to avoid foods and drinks such as: ? Coffee and tea (with or without caffeine). ? Drinks that contain alcohol. ? Energy drinks and sports drinks. ? Bubbly (carbonated) drinks or sodas. ? Chocolate and cocoa. ? Peppermint and mint flavorings. ? Garlic and onions. ? Horseradish. ? Spicy and acidic foods. These include peppers, chili powder, curry powder, vinegar, hot sauces, and BBQ sauce. ? Citrus fruit juices and citrus fruits, such as oranges, lemons, and limes. ? Tomato-based foods. These include red sauce, chili, salsa, and pizza with red sauce. ? Fried and fatty foods. These include donuts, french fries, potato chips, and high-fat dressings. ? High-fat meats. These include hot dogs, rib eye steak, sausage, ham,  and bacon. ? High-fat dairy items, such as whole milk, butter, and cream cheese.  Eat small meals often. Avoid eating large meals.  Avoid drinking large amounts of liquid with your meals.  Avoid eating meals during the 2-3 hours before bedtime.  Avoid lying down right after you eat.  Do not exercise right after you eat. Lifestyle   Do not use any products that contain nicotine or tobacco. These  include cigarettes, e-cigarettes, and chewing tobacco. If you need help quitting, ask your doctor.  Try to lower your stress. If you need help doing this, ask your doctor.  If you are overweight, lose an amount of weight that is healthy for you. Ask your doctor about a safe weight loss goal. General instructions  Pay attention to any changes in your symptoms.  Take over-the-counter and prescription medicines only as told by your doctor. Do not take aspirin, ibuprofen, or other NSAIDs unless your doctor says it is okay.  Wear loose clothes. Do not wear anything tight around your waist.  Raise (elevate) the head of your bed about 6 inches (15 cm).  Avoid bending over if this makes your symptoms worse.  Keep all follow-up visits as told by your doctor. This is important. Contact a doctor if:  You have new symptoms.  You lose weight and you do not know why.  You have trouble swallowing or it hurts to swallow.  You have wheezing or a cough that keeps happening.  Your symptoms do not get better with treatment.  You have a hoarse voice. Get help right away if:  You have pain in your arms, neck, jaw, teeth, or back.  You feel sweaty, dizzy, or light-headed.  You have chest pain or shortness of breath.  You throw up (vomit) and your throw-up looks like blood or coffee grounds.  You pass out (faint).  Your poop (stool) is bloody or black.  You cannot swallow, drink, or eat. Summary  If a person has gastroesophageal reflux disease (GERD), food and stomach acid move back up into the esophagus and cause symptoms or problems such as damage to the esophagus.  Treatment will depend on how bad your symptoms are.  Follow a diet as told by your doctor.  Take all medicines only as told by your doctor. This information is not intended to replace advice given to you by your health care provider. Make sure you discuss any questions you have with your health care provider. Document  Released: 10/05/2007 Document Revised: 10/25/2017 Document Reviewed: 10/25/2017 Elsevier Interactive Patient Education  2019 [employer]

## 2018-06-07 DIAGNOSIS — K2100 Gastro-esophageal reflux disease with esophagitis, without bleeding: Secondary | ICD-10-CM | POA: Insufficient documentation

## 2018-06-07 NOTE — Progress Notes (Signed)
Caleb Frederick, is a 43 y.o. male  WGN:[id number]:[id number]  HQI:[id number]  DOB - 06/02/1975  CC:  Chief Complaint  Patient presents with  . Establish Care  . Follow-up    UC 1/7 & 1/21: cough       HPI: Caleb Frederick is a 43 y.o. male is here today to establish care.   Caleb Frederick has BMI 40.0-44.9, adult [facility](HCC) on their problem list.   Patient presents today for evaluation of a persistent cough which she has been seen previously at urgent care on 2 separate occasions and once at the ER.  Cough has been ongoing since the middle of December.  He has been treated with antibiotics, oral steroids, prednisone, and Flonase.  He reports that cough occurs mostly now at nighttime and recently he has been able to associate it with intake of certain foods such as chocolate.  He recently trialed a dose of his wife's omeprazole 20 mg and has felt some improvement with taking this medication.  He has no history of acid reflux.  He is also taking promethazine during the night to help sleep and calm cough which is also been effective.  He has no history of asthma therefore has stopped using the albuterol inhaler as he found little benefit with use.  He denies any shortness of breath, chest tightness, cough is nonproductive, he has had no fever, and denies nasal congestion or drainage.  Current medications: Current Outpatient Medications:  .  albuterol (PROVENTIL HFA;VENTOLIN HFA) 108 (90 Base) MCG/ACT inhaler, Inhale 1-2 puffs into the lungs every 6 (six) hours as needed for wheezing or shortness of breath., Disp: 1 Inhaler, Rfl: 0 .  famotidine (PEPCID) 20 MG tablet, Take 1 tablet (20 mg total) by mouth 2 (two) times daily., Disp: 40 tablet, Rfl: 0 .  fluticasone (FLONASE) 50 MCG/ACT nasal spray, Place 2 sprays into both nostrils daily., Disp: 16 g, Rfl: 0 .  pantoprazole (PROTONIX) 20 MG tablet, Take 1 tablet (20 mg total) by mouth daily., Disp: 30 tablet, Rfl: 0 .  omeprazole (PRILOSEC) 40 MG capsule, Take 1  capsule (40 mg total) by mouth daily., Disp: 30 capsule, Rfl: 3 .  promethazine (PHENERGAN) 6.25 MG/5ML syrup, TAKE 5 ML AT BEDTIME AS NEEDED, Disp: 120 mL, Rfl: 0   Pertinent family medical history: family history includes GER disease in his maternal aunt; Heart disease in his maternal grandmother; Hypertension in his maternal grandmother; Mental illness in his mother; Stroke in his mother.   No Known Allergies  Social History   Socioeconomic History  . Marital status: Single    Spouse name: Not on file  . Number of children: 2  . Years of education: Not on file  . Highest education level: Not on file  Occupational History  . Not on file  Social Needs  . Financial resource strain: Not on file  . Food insecurity:    Worry: Not on file    Inability: Not on file  . Transportation needs:    Medical: Not on file    Non-medical: Not on file  Tobacco Use  . Smoking status: Never Smoker  . Smokeless tobacco: Never Used  Substance and Sexual Activity  . Alcohol use: No  . Drug use: No  . Sexual activity: Not on file  Lifestyle  . Physical activity:    Days per week: Not on file    Minutes per session: Not on file  . Stress: Not on file  Relationships  . Social connections:  Talks on phone: Not on file    Gets together: Not on file    Attends religious service: Not on file    Active member of club or organization: Not on file    Attends meetings of clubs or organizations: Not on file    Relationship status: Not on file  . Intimate partner violence:    Fear of current or ex partner: Not on file    Emotionally abused: Not on file    Physically abused: Not on file    Forced sexual activity: Not on file  Other Topics Concern  . Not on file  Social History Narrative  . Not on file    Review of Systems: Constitutional: Negative for fever, chills, diaphoresis, activity change, appetite change and fatigue. HENT: Negative for ear pain, nosebleeds, congestion, facial  swelling, rhinorrhea, neck pain, neck stiffness and ear discharge.  Eyes: Negative for pain, discharge, redness, itching and visual disturbance. Respiratory: Negative for cough, choking, chest tightness, shortness of breath, wheezing and stridor.  Cardiovascular: Negative for chest pain, palpitations and leg swelling. Gastrointestinal: Negative for abdominal distention. Genitourinary: Negative for dysuria, urgency, frequency, hematuria, flank pain, decreased urine volume, difficulty urinating. Musculoskeletal: Negative for back pain, joint swelling, arthralgia and gait problem. Neurological: Negative for dizziness, tremors, seizures, syncope, facial asymmetry, speech difficulty, weakness, light-headedness, numbness and headaches.  Hematological: Negative for adenopathy. Does not bruise/bleed easily. Psychiatric/Behavioral: Negative for hallucinations, behavioral problems, confusion, dysphoric mood, decreased concentration and agitation.    Objective:   Vitals:   06/04/18 1142  BP: 105/72  Pulse: 88  Resp: 17  Temp: 98.1 F (36.7 C)  SpO2: 95%    BP Readings from Last 3 Encounters:  06/04/18 105/72  05/22/18 (!) 145/77  05/08/18 119/81   Filed Weights   06/04/18 1142  Weight: 280 lb 6.4 oz (127.2 kg)      Physical Exam: Constitutional: Patient appears well-developed and well-nourished. No distress. HENT: Normocephalic, atraumatic, External right and left ear normal. Oropharynx is clear and moist.  Eyes: Conjunctivae and EOM are normal. PERRLA, no scleral icterus. Neck: Normal ROM. Neck supple. No JVD. No tracheal deviation. No thyromegaly. CVS: RRR, S1/S2 +, no murmurs, no gallops, no carotid bruit.  Pulmonary: Effort and breath sounds normal, no stridor, rhonchi, wheezes, rales.  Abdominal: Soft. BS +, no distension, tenderness, rebound or guarding.  Musculoskeletal: Normal range of motion. No edema and no tenderness.  Neuro: Alert. Normal muscle tone coordination. Normal  gait. BUE and BLE strength 5/5. Bilateral hand grips symmetrical. No cranial nerve deficit. Skin: Skin is warm and dry. No rash noted. Not diaphoretic. No erythema. No pallor. Psychiatric: Normal mood and affect. Behavior, judgment, thought content normal.     Assessment and plan:  1. Encounter to establish care  2. Cough Continue promethazine daily at bedtime as needed for cough. Take care to avoid foods which she have identified that causes irritation and worsens cough at least 2 to 3 hours prior to bedtime.  3. Gastroesophageal reflux disease with esophagitis, Possible  Suspect cough is related to new diagnosis acid reflux.  Increased omeprazole to 40 mg daily.  Advised patient to continue this regimen for the next few weeks if cough has not improved by that time will refer him to GI for further evaluation of GERD.  Return in about 3 weeks (around 06/25/2018).   The patient was given clear instructions to go to ER or return to medical center if symptoms don't improve, worsen or new problems develop.  The patient verbalized understanding. The patient was advised  to call and obtain lab results if they haven't heard anything from out office within 7-10 business days.  Joaquin Courts, FNP Primary Care at [facility] [address], [place] [address] [phone]: [phone]    This note has been created with Dragon speech recognition software and Paediatric nurse. Any transcriptional errors are unintentional.

## 2018-06-27 ENCOUNTER — Encounter: Payer: Self-pay | Admitting: Family Medicine

## 2018-06-27 ENCOUNTER — Ambulatory Visit (INDEPENDENT_AMBULATORY_CARE_PROVIDER_SITE_OTHER): Payer: 59 | Admitting: Family Medicine

## 2018-06-27 VITALS — BP 120/85 | HR 74 | Resp 17 | Ht 70.0 in | Wt 291.0 lb

## 2018-06-27 DIAGNOSIS — K2100 Gastro-esophageal reflux disease with esophagitis, without bleeding: Secondary | ICD-10-CM

## 2018-06-27 DIAGNOSIS — R05 Cough: Secondary | ICD-10-CM

## 2018-06-27 DIAGNOSIS — R059 Cough, unspecified: Secondary | ICD-10-CM

## 2018-07-01 NOTE — Progress Notes (Signed)
Patient ID: Caleb Frederick, male    DOB: [birth year], 43 y.o.   MRN: [id number]  PCP: Bing Neighbors, FNP  Chief Complaint  Patient presents with  . Gastroesophageal Reflux    Subjective:  HPI Caleb Frederick is a 43 y.o. male presents for evaluation of cough and GERD.  Last office visit  Patient presents today for evaluation of a persistent cough which she has been seen previously at urgent care on 2 separate occasions and once at the ER.  Cough has been ongoing since the middle of December.  He has been treated with antibiotics, oral steroids, prednisone, and Flonase.  He reports that cough occurs mostly now at nighttime and recently he has been able to associate it with intake of certain foods such as chocolate.  He recently trialed a dose of his wife's omeprazole 20 mg and has felt some improvement with taking this medication.  He has no history of acid reflux.  He is also taking promethazine during the night to help sleep and calm cough which is also been effective.  He has no history of asthma therefore has stopped using the albuterol inhaler as he found little benefit with use.  He denies any shortness of breath, chest tightness, cough is nonproductive, he has had no fever, and denies nasal congestion or drainage.  Today's visit Patient reports cough has improved approximately 80% since he was last seen.  Can use omeprazole 40 mg once daily for cough and acid reflux symptoms.  He has noticed that his cough worsens with spicy intake of foods and dairy.  Avoidance of these type foods has helped his symptoms.  His cough occurs mostly while lying down at night. Cough is not occurring every night.  He has not had to take Phenergan recently since making changes in his diet.  He denies nausea, vomiting, changes in stools, URI symptoms, shortness of breath, or chest tightness associated with cough. Social History   Socioeconomic History  . Marital status: Single    Spouse name: Not on file  .  Number of children: 2  . Years of education: Not on file  . Highest education level: Not on file  Occupational History  . Not on file  Social Needs  . Financial resource strain: Not on file  . Food insecurity:    Worry: Not on file    Inability: Not on file  . Transportation needs:    Medical: Not on file    Non-medical: Not on file  Tobacco Use  . Smoking status: Never Smoker  . Smokeless tobacco: Never Used  Substance and Sexual Activity  . Alcohol use: No  . Drug use: No  . Sexual activity: Not on file  Lifestyle  . Physical activity:    Days per week: Not on file    Minutes per session: Not on file  . Stress: Not on file  Relationships  . Social connections:    Talks on phone: Not on file    Gets together: Not on file    Attends religious service: Not on file    Active member of club or organization: Not on file    Attends meetings of clubs or organizations: Not on file    Relationship status: Not on file  . Intimate partner violence:    Fear of current or ex partner: Not on file    Emotionally abused: Not on file    Physically abused: Not on file    Forced sexual activity: Not on  file  Other Topics Concern  . Not on file  Social History Narrative  . Not on file    Family History  Problem Relation Age of Onset  . Mental illness Mother   . Stroke Mother   . Heart disease Maternal Grandmother   . Hypertension Maternal Grandmother   . GER disease Maternal Aunt   . Cancer Neg Hx      Review of Systems Pertinent negatives listed in HPI Patient Active Problem List   Diagnosis Date Noted  . Gastroesophageal reflux disease with esophagitis, Possible  06/07/2018  . BMI 40.0-44.9, adult (HCC) 05/12/2015    No Known Allergies  Prior to Admission medications   Medication Sig Start Date End Date Taking? Authorizing Provider  fluticasone (FLONASE) 50 MCG/ACT nasal spray Place 2 sprays into both nostrils daily. 05/08/18  Yes Domenick Gong, MD  omeprazole  (PRILOSEC) 40 MG capsule Take 1 capsule (40 mg total) by mouth daily. 06/04/18  Yes Bing Neighbors, FNP  promethazine (PHENERGAN) 6.25 MG/5ML syrup TAKE 5 ML AT BEDTIME AS NEEDED 06/04/18  Yes Bing Neighbors, FNP    Past Medical, Surgical Family and Social History reviewed and updated.    Objective:   Today's Vitals   06/27/18 1454  BP: 120/85  Pulse: 74  Resp: 17  SpO2: 95%  Weight: 291 lb (132 kg)  Height: 5\' 10"  (1.778 m)    Wt Readings from Last 3 Encounters:  06/27/18 291 lb (132 kg)  06/04/18 280 lb 6.4 oz (127.2 kg)  01/18/18 284 lb (128.8 kg)     Physical Exam General appearance: alert, well developed, well nourished, cooperative and in no distress Head: Normocephalic, without obvious abnormality, atraumatic Respiratory: Respirations even and unlabored, normal respiratory rate Heart: rate and rhythm normal. No gallop or murmurs noted on exam  Abdomen: BS +, no distention, rebound tenderness, or mass Extremities: No gross deformities Skin: Skin color, texture, turgor normal. No rashes seen  Psych: Appropriate mood and affect. Neurologic: Mental status: Alert, oriented to person, place, and time, thought content appropriate.   Assessment & Plan:  1. Cough -improved significantly,although continues to occur. Encourage avoid eating after 7 pm, increase intake of water, and continue to avoid foods that exacerbate symptoms of indigestion and cough  2. Gastroesophageal reflux disease with esophagitis, Possible  -Continue Omeprazole 40 mg . If no improvement with current dose, okay to increase dose to 60 mg once daily for a period of 4 week then reduce back to 40 mg once daily. We discussed if cough and reflux symptoms remain uncontrolled, I would highly recommend GI referral for a likely EGD. Given high deductible of current insurance coverage, patient would like to defer referral for now as symptoms seem to be improving.    Patient is purchasing omeprazole at [facility] therefore no medications ordered.  -The patient was given clear instructions to go to ER or return to medical center if symptoms do not improve, worsen or new problems develop. The patient verbalized understanding.    Joaquin Courts, FNP Primary Care at [facility] [address], [place] [address] [phone]: [phone]

## 2019-02-08 IMAGING — DX DG CHEST 2V
2 series · 2 of 2 positions shown · non-contrast
Comparison: None.

CLINICAL DATA: Persistent cough and congestion 6 weeks.

EXAM:
CHEST - 2 VIEW

[chest pa]
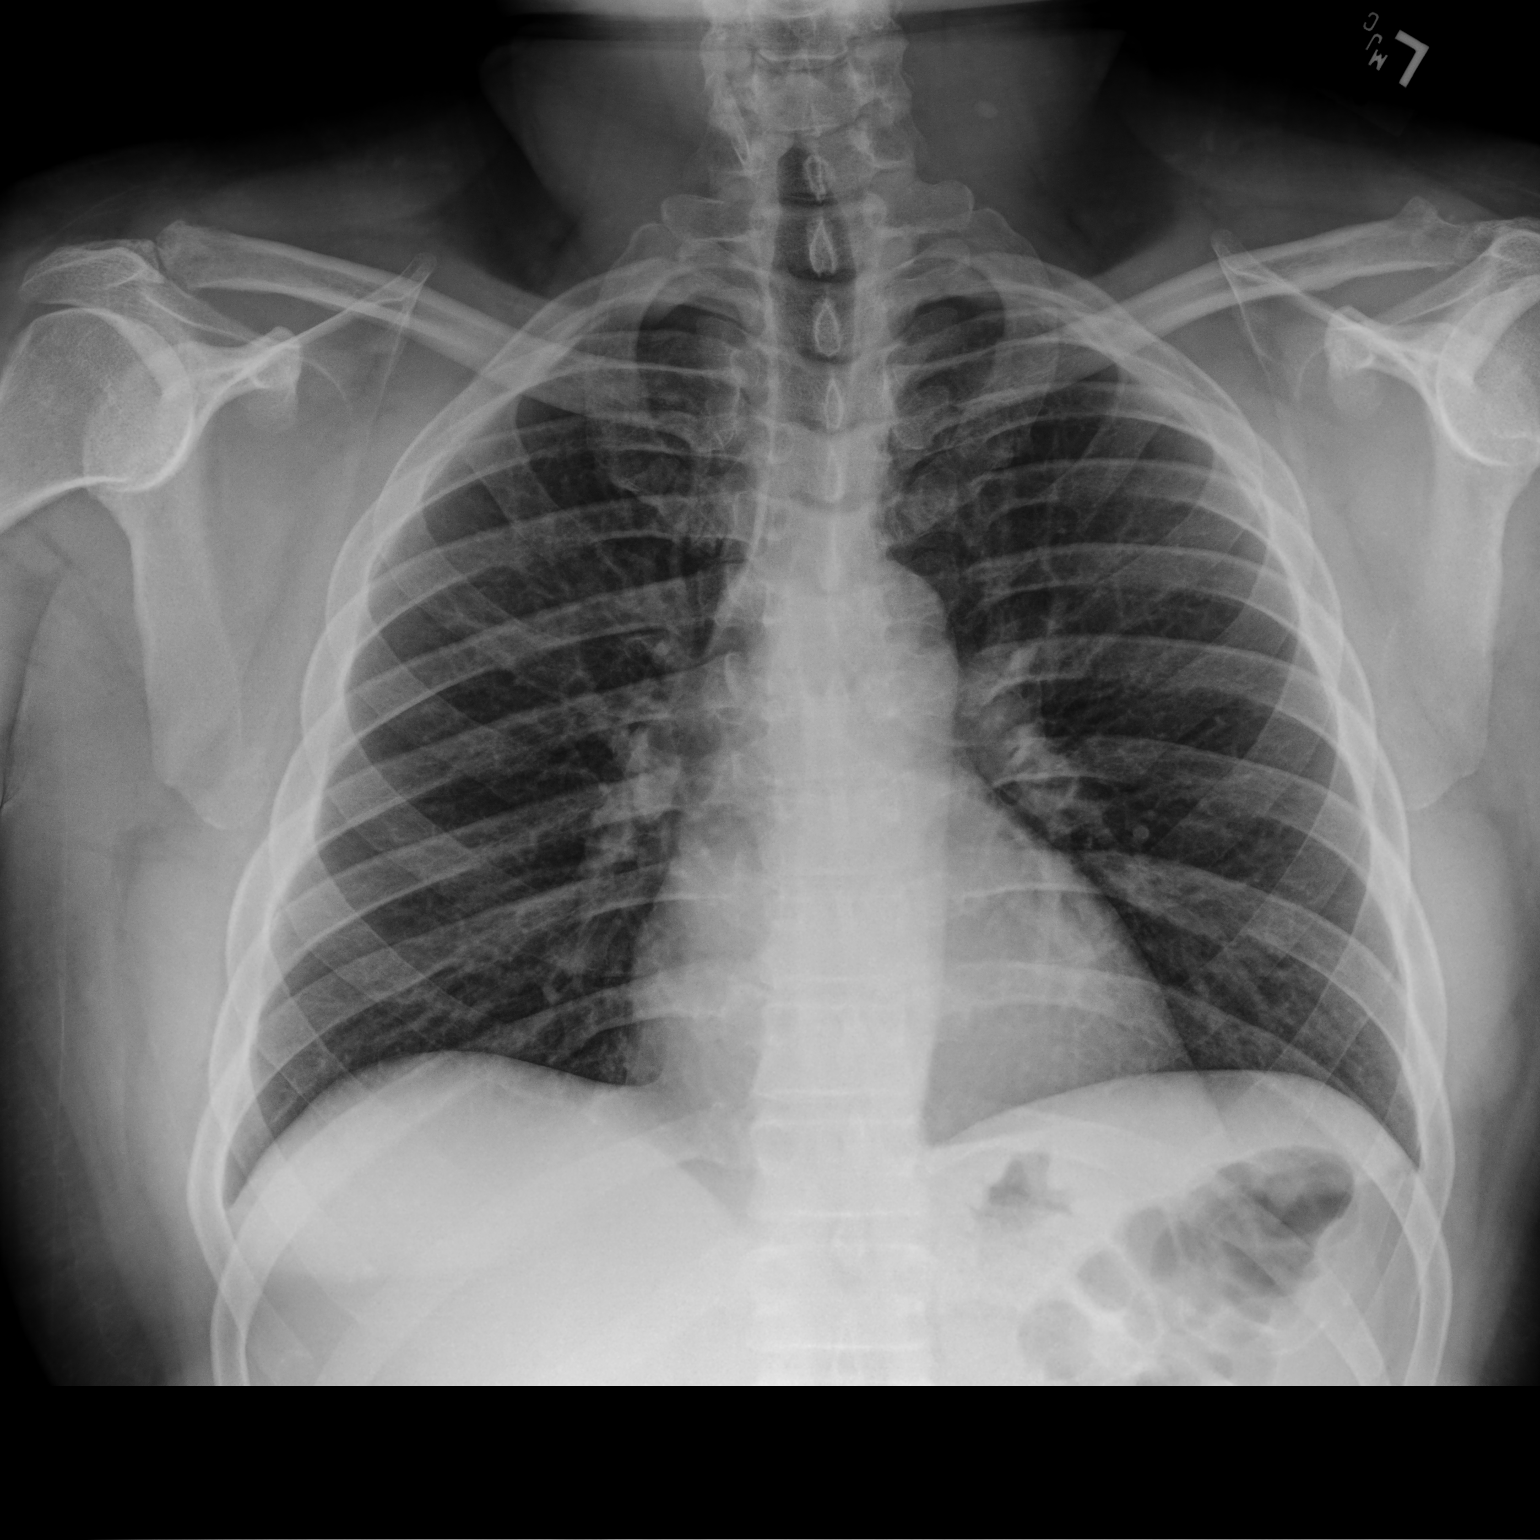

[chest lat]
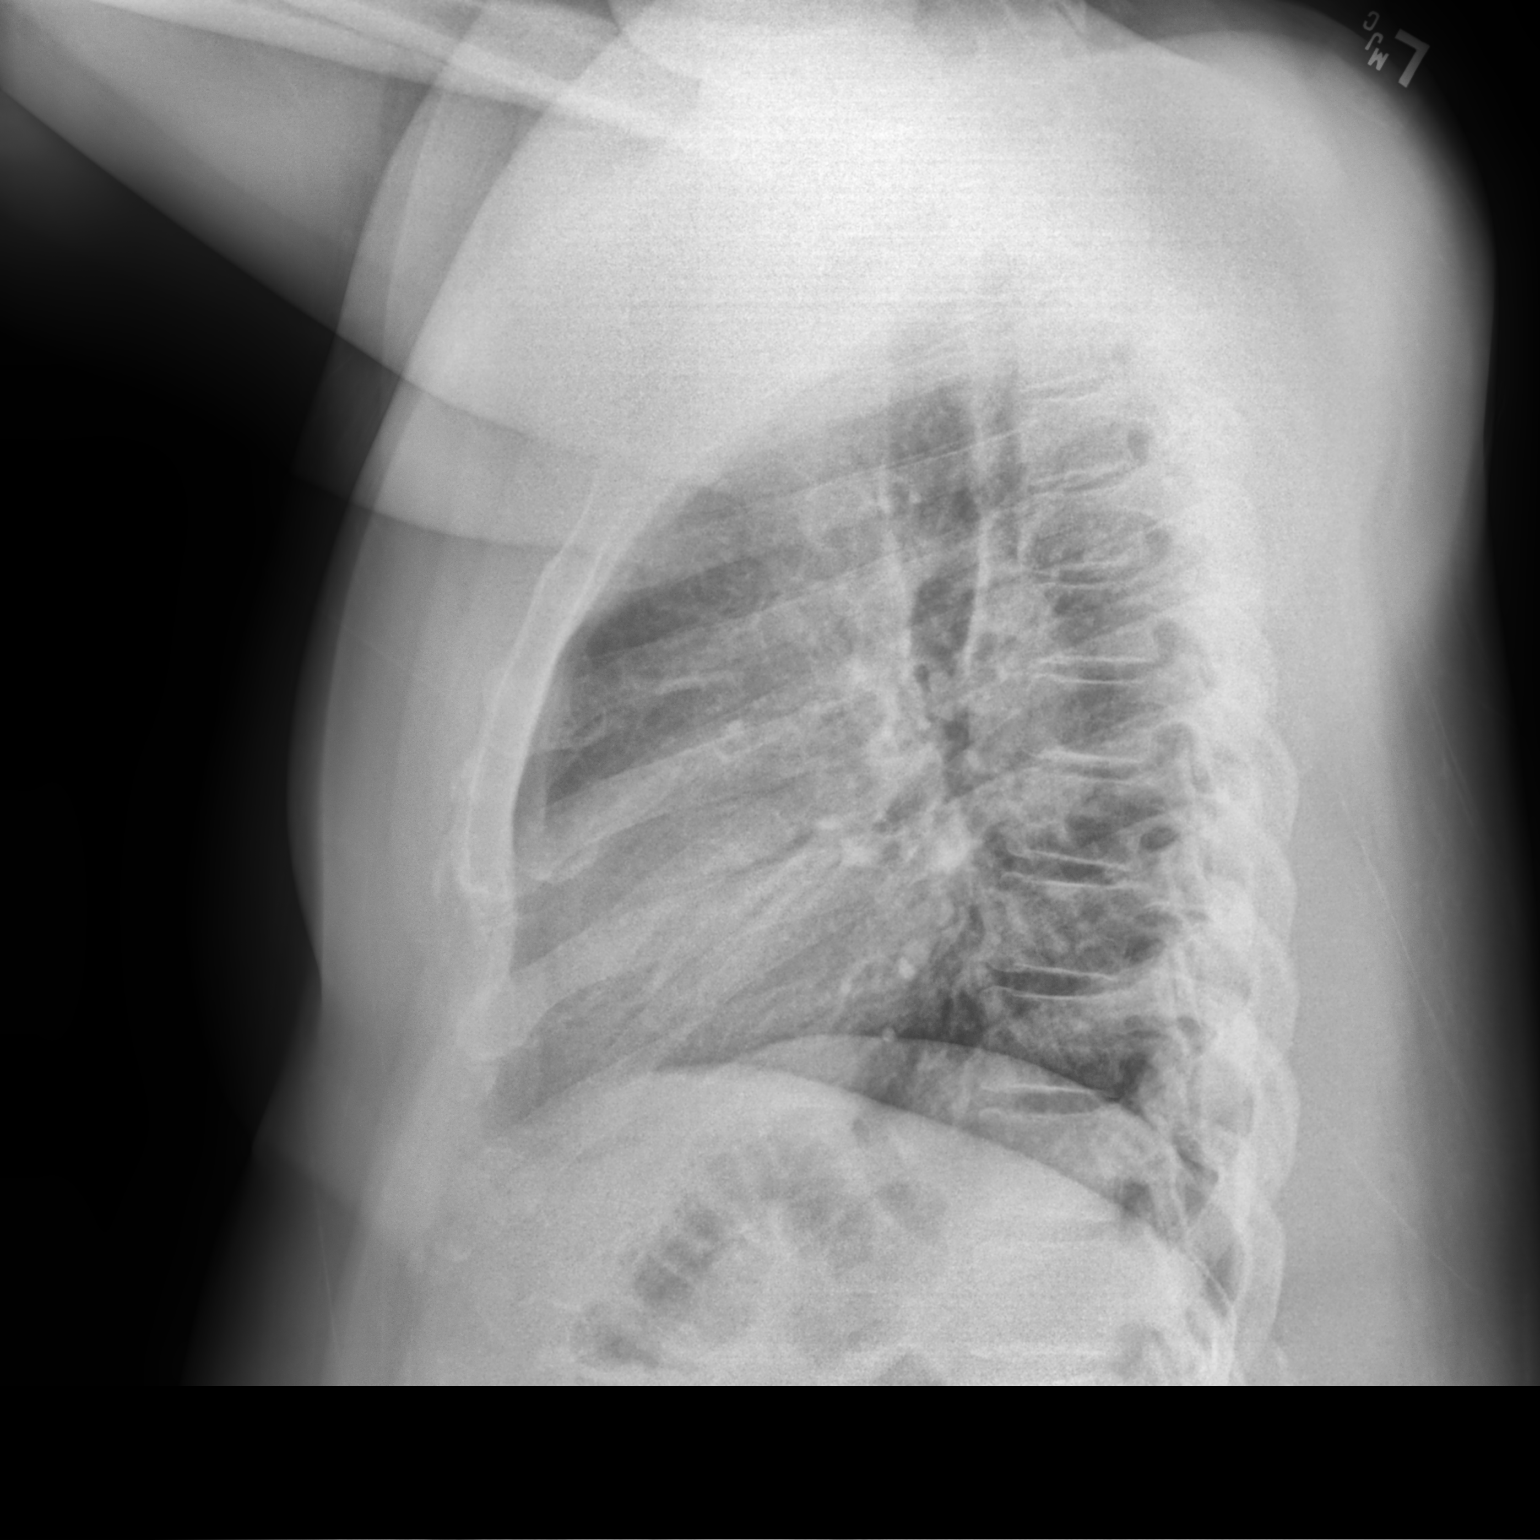

[2 of 2 positions shown; findings below may reference images not displayed]

FINDINGS: Cardiomediastinal silhouette is normal. Mediastinal contours appear
intact.

There is no evidence of focal airspace consolidation, pleural
effusion or pneumothorax.

Osseous structures are without acute abnormality. Soft tissues are
grossly normal.
IMPRESSION: No active cardiopulmonary disease.

## 2020-05-11 ENCOUNTER — Encounter (HOSPITAL_COMMUNITY): Payer: Self-pay

## 2020-05-11 ENCOUNTER — Ambulatory Visit (HOSPITAL_COMMUNITY)
Admission: EM | Admit: 2020-05-11 | Discharge: 2020-05-11 | Disposition: A | Discharge status: Home / Self Care | Payer: 59 | Attending: Student | Admitting: Student

## 2020-05-11 ENCOUNTER — Other Ambulatory Visit: Payer: Self-pay

## 2020-05-11 DIAGNOSIS — S39012A Strain of muscle, fascia and tendon of lower back, initial encounter: Secondary | ICD-10-CM

## 2020-05-11 DIAGNOSIS — Z113 Encounter for screening for infections with a predominantly sexual mode of transmission: Secondary | ICD-10-CM | POA: Diagnosis not present

## 2020-05-11 LAB — POCT URINALYSIS DIPSTICK, ED / UC
Bilirubin Urine: NEGATIVE
Glucose, UA: NEGATIVE mg/dL
Hgb urine dipstick: NEGATIVE
Ketones, ur: NEGATIVE mg/dL
Leukocytes,Ua: NEGATIVE
Nitrite: NEGATIVE
Protein, ur: NEGATIVE mg/dL
Specific Gravity, Urine: >=1.03 (ref 1.005–1.030)
Urobilinogen, UA: 0.2 mg/dL (ref 0.0–1.0)
pH: 6.5 (ref 5.0–8.0)

## 2020-05-11 MED ORDER — IBUPROFEN 800 MG PO TABS: 800.0000 mg | ORAL_TABLET | Freq: Three times a day (TID) | ORAL | 0 refills | Status: DC

## 2020-05-11 MED ORDER — CYCLOBENZAPRINE HCL 10 MG PO TABS: 10.0000 mg | ORAL_TABLET | Freq: Two times a day (BID) | ORAL | 0 refills | Status: DC | PRN

## 2020-05-11 NOTE — ED Provider Notes (Signed)
[facility]    CSN: [id number] Arrival date & time: 05/11/20  0801      History   Chief Complaint Chief Complaint  Patient presents with  . tingling sensation when urinating  . Urinary Frequency  . Back Pain    HPI [facility] is a 45 y.o. male presenting for tingling of the head of penis, and back pain.  -States the head of his penis has occasionally tingled over the last 3 weeks, not currently. tingling happens randomly and is not related to urinating. Adamantly denise lesions, discharge, lumps. No history of HSV. Denies STI risk, denies new partners. Denies hematuria, dysuria, frequency, urgency,  n/v/d/abd pain, fevers/chills, abdnormal discharge. Declines HIV, syphilis screen. -Also endorses back pain for 3 weeks, which he states is exacerbated by his job, which is active and involves lots of heavy lifting. Back pain right sided and is worse with movement. Denies pain shooting down legs, denies numbness in arms/legs, denies weakness in arms/legs, denies saddle anesthesia, denies bowel/bladder incontinence. Stating he requires something for his pain.   HPI  Past Medical History:  Diagnosis Date  . Allergy     Patient Active Problem List   Diagnosis Date Noted  . Gastroesophageal reflux disease with esophagitis, Possible  06/07/2018  . BMI 40.0-44.9, adult (HCC) 05/12/2015    Past Surgical History:  Procedure Laterality Date  . VASECTOMY  02/15/2016       Home Medications    Prior to Admission medications   Medication Sig Start Date End Date Taking? Authorizing Provider  cyclobenzaprine (FLEXERIL) 10 MG tablet Take 1 tablet (10 mg total) by mouth 2 (two) times daily as needed for muscle spasms. 05/11/20  Yes Rhys Martini, PA-C  ibuprofen (ADVIL) 800 MG tablet Take 1 tablet (800 mg total) by mouth 3 (three) times daily. 05/11/20  Yes Rhys Martini, PA-C  fluticasone (FLONASE) 50 MCG/ACT nasal spray Place 2 sprays into both nostrils daily. 05/08/18    Domenick Gong, MD  omeprazole (PRILOSEC) 40 MG capsule Take 1 capsule (40 mg total) by mouth daily. 06/04/18   Bing Neighbors, FNP  promethazine (PHENERGAN) 6.25 MG/5ML syrup TAKE 5 ML AT BEDTIME AS NEEDED 06/04/18   Bing Neighbors, FNP    Family History Family History  Problem Relation Age of Onset  . Mental illness Mother   . Stroke Mother   . Heart disease Maternal Grandmother   . Hypertension Maternal Grandmother   . GER disease Maternal Aunt   . Cancer Neg Hx     Social History Social History   Tobacco Use  . Smoking status: Never Smoker  . Smokeless tobacco: Never Used  Substance Use Topics  . Alcohol use: No  . Drug use: No     Allergies   Patient has no known allergies.   Review of Systems Review of Systems  Musculoskeletal: Positive for back pain.  All other systems reviewed and are negative.    Physical Exam Triage Vital Signs ED Triage Vitals  Enc Vitals Group     BP 05/11/20 0819 (!) 136/92     Pulse Rate 05/11/20 0819 76     Resp 05/11/20 0819 (!) 21     Temp 05/11/20 0819 98.2 F (36.8 C)     Temp Source 05/11/20 0819 Oral     SpO2 05/11/20 0819 98 %     Weight --      Height --      Head Circumference --  Peak Flow --      Pain Score 05/11/20 0818 7     Pain Loc --      Pain Edu? --      Excl. in GC? --    No data found.  Updated Vital Signs BP (!) 136/92   Pulse 76   Temp 98.2 F (36.8 C) (Oral)   Resp (!) 21   SpO2 98%   Visual Acuity Right Eye Distance:   Left Eye Distance:   Bilateral Distance:    Right Eye Near:   Left Eye Near:    Bilateral Near:     Physical Exam Vitals reviewed.  Constitutional:      General: He is not in acute distress.    Appearance: Normal appearance. He is not ill-appearing.  HENT:     Head: Normocephalic and atraumatic.  Cardiovascular:     Rate and Rhythm: Normal rate and regular rhythm.     Heart sounds: Normal heart sounds.  Pulmonary:     Effort: Pulmonary effort is  normal.     Breath sounds: Normal breath sounds and air entry. No wheezing, rhonchi or rales.  Abdominal:     General: Bowel sounds are normal. There is no distension.     Palpations: Abdomen is soft. There is no mass.     Tenderness: There is no abdominal tenderness. There is no right CVA tenderness, left CVA tenderness, guarding or rebound.     Comments: No bowel or bladder incontinence.  Musculoskeletal:     Cervical back: Normal range of motion. No swelling, deformity, signs of trauma, rigidity, spasms, tenderness, bony tenderness or crepitus. No pain with movement.     Thoracic back: No swelling, deformity, signs of trauma, spasms, tenderness or bony tenderness. Normal range of motion. No scoliosis.     Lumbar back: Tenderness present. No swelling, deformity, signs of trauma, spasms or bony tenderness. Normal range of motion. Negative right straight leg raise test and negative left straight leg raise test. No scoliosis.     Comments: R lumbar paraspinous muscle tenderness to deep palpation. Strength 5/5 in UEs and LEs.   Neurological:     General: No focal deficit present.     Mental Status: He is alert and oriented to person, place, and time.     Cranial Nerves: No cranial nerve deficit.     Comments: Strength 5/5 in UEs and LEs. Gait normal. Sensation intact in UEs and LEs.   Psychiatric:        Mood and Affect: Mood normal.        Behavior: Behavior normal.        Thought Content: Thought content normal.        Judgment: Judgment normal.      UC Treatments / Results  Labs (all labs ordered are listed, but only abnormal results are displayed) Labs Reviewed  URINE CULTURE  POCT URINALYSIS DIPSTICK, ED / UC  CYTOLOGY, (ORAL, ANAL, URETHRAL) ANCILLARY ONLY    EKG   Radiology No results found.  Procedures Procedures (including critical care time)  Medications Ordered in UC Medications - No data to display  Initial Impression / Assessment and Plan / UC Course  I have  reviewed the triage vital signs and the nursing notes.  Pertinent labs & imaging results that were available during my care of the patient were reviewed by me and considered in my medical decision making (see chart for details).     UA today wnl. Culture sent.  STI labs- G/C, trichimonas. Declines HIV, syphillis.   We have sent testing for sexually transmitted infections. We will notify you of any positive results once they are received. If required, we will prescribe any medications you might need.   Please refrain from all sexual activity for at least the next seven days.  Seek additional medical attention if you develop fevers/chills, new/worsening abdominal pain, new/worsening vaginal discomfort/discharge, etc. Patient verbalizes understanding and agreement.  Flexeril for lumbar spasm. Denies pain shooting down legs, denies numbness in arms/legs, denies weakness in arms/legs, denies saddle anesthesia, denies bowel/bladder incontinence.    Final Clinical Impressions(s) / UC Diagnoses   Final diagnoses:  Strain of lumbar region, initial encounter  Routine screening for STI (sexually transmitted infection)     Discharge Instructions     -Take the muscle relaxer up to 2x daily for back muscle strain. Take this at night as it can make you tired.  -Take ibuprofen 800mg  up to 3x daily with food.  -We'll call you if any of your labwork is abnormal.    ED Prescriptions    Medication Sig Dispense Auth. Provider   cyclobenzaprine (FLEXERIL) 10 MG tablet Take 1 tablet (10 mg total) by mouth 2 (two) times daily as needed for muscle spasms. 20 tablet , PA-C   ibuprofen (ADVIL) 800 MG tablet Take 1 tablet (800 mg total) by mouth 3 (three) times daily. 21 tablet Rhys Martini, PA-C     PDMP not reviewed this encounter.   Rhys Martini, PA-C 05/11/20 [phone]

## 2020-05-11 NOTE — Discharge Instructions (Addendum)
-  Take the muscle relaxer up to 2x daily for back muscle strain. Take this at night as it can make you tired.  -Take ibuprofen 800mg  up to 3x daily with food.  -We'll call you if any of your labwork is abnormal.

## 2020-05-11 NOTE — ED Triage Notes (Addendum)
Pt in with c/o lower right back pain, urinary frequency and tingling sensation in penis.  Pt has taken aleve for sxs  Also wants to be checked out "down there"

## 2020-05-12 LAB — CYTOLOGY, (ORAL, ANAL, URETHRAL) ANCILLARY ONLY
Chlamydia: NEGATIVE
Comment: NEGATIVE
Comment: NEGATIVE
Comment: NORMAL
Neisseria Gonorrhea: NEGATIVE
Trichomonas: NEGATIVE

## 2020-05-12 LAB — URINE CULTURE: Culture: NO GROWTH

## 2021-01-05 ENCOUNTER — Other Ambulatory Visit: Payer: Self-pay

## 2021-01-05 ENCOUNTER — Ambulatory Visit (HOSPITAL_COMMUNITY)
Admission: EM | Admit: 2021-01-05 | Discharge: 2021-01-05 | Disposition: A | Discharge status: Home / Self Care | Payer: 59 | Attending: Student | Admitting: Student

## 2021-01-05 ENCOUNTER — Encounter (HOSPITAL_COMMUNITY): Payer: Self-pay | Admitting: Emergency Medicine

## 2021-01-05 DIAGNOSIS — Z8619 Personal history of other infectious and parasitic diseases: Secondary | ICD-10-CM | POA: Diagnosis present

## 2021-01-05 DIAGNOSIS — R202 Paresthesia of skin: Secondary | ICD-10-CM

## 2021-01-05 DIAGNOSIS — Z0189 Encounter for other specified special examinations: Secondary | ICD-10-CM | POA: Insufficient documentation

## 2021-01-05 DIAGNOSIS — Z113 Encounter for screening for infections with a predominantly sexual mode of transmission: Secondary | ICD-10-CM | POA: Insufficient documentation

## 2021-01-05 LAB — POCT URINALYSIS DIPSTICK, ED / UC
Bilirubin Urine: NEGATIVE
Glucose, UA: NEGATIVE mg/dL
Hgb urine dipstick: NEGATIVE
Ketones, ur: NEGATIVE mg/dL
Leukocytes,Ua: NEGATIVE
Nitrite: NEGATIVE
Protein, ur: NEGATIVE mg/dL
Specific Gravity, Urine: 1.02 (ref 1.005–1.030)
Urobilinogen, UA: 1 mg/dL (ref 0.0–1.0)
pH: 7 (ref 5.0–8.0)

## 2021-01-05 LAB — CBG MONITORING, ED: Glucose-Capillary: 93 mg/dL (ref 70–99)

## 2021-01-05 NOTE — ED Triage Notes (Signed)
No burning with urination.  Patient feels random tingling sensation.  Denies penile discharge.  Patient refers to taking phentermine for diet and wonders if this is contributing

## 2021-01-05 NOTE — Discharge Instructions (Addendum)
-  We'll call you in about 2 days if your labwork is abnormal -Follow-up with the provider who prescribes your weight loss medication if you continue to have symptoms, as this could be contributing

## 2021-01-05 NOTE — ED Provider Notes (Signed)
[facility]    CSN: [id number] Arrival date & time: 01/05/21  0801      History   Chief Complaint Chief Complaint  Patient presents with   penile discomfort    HPI [facility] is a 45 y.o. male presenting with intermittent tingling in penis x2 months following starting on phentermine. Medical history obesity, currently taking phentermine for weight loss x2 months. History trichomonas 2019 per chart review. Describes tingling of the tip of the penis.  Unable to identify any triggers for the sensation.  Denies penile pain, swelling, discharge, lesions. denies dysuria, discharge, smell, etc. denies new partners.  Requesting screening for diabetes, he is not a diabetic.  HPI  Past Medical History:  Diagnosis Date   Allergy     Patient Active Problem List   Diagnosis Date Noted   Gastroesophageal reflux disease with esophagitis, Possible  06/07/2018   BMI 40.0-44.9, adult (HCC) 05/12/2015    Past Surgical History:  Procedure Laterality Date   VASECTOMY  02/15/2016       Home Medications    Prior to Admission medications   Medication Sig Start Date End Date Taking? Authorizing Provider  ibuprofen (ADVIL) 800 MG tablet Take 1 tablet (800 mg total) by mouth 3 (three) times daily. 05/11/20   Rhys Martini, PA-C  phentermine (ADIPEX-P) 37.5 MG tablet Take 37.5 mg by mouth daily. 11/30/20   [provider]    Family History Family History  Problem Relation Age of Onset   Mental illness Mother    Stroke Mother    Hypertension Father    Heart disease Maternal Grandmother    Hypertension Maternal Grandmother    GER disease Maternal Aunt    Cancer Neg Hx     Social History Social History   Tobacco Use   Smoking status: Never   Smokeless tobacco: Never  Vaping Use   Vaping Use: Never used  Substance Use Topics   Alcohol use: No   Drug use: No     Allergies   Patient has no known allergies.   Review of Systems Review of Systems   Constitutional:  Negative for chills and fever.  HENT:  Negative for sore throat.   Eyes:  Negative for pain and redness.  Respiratory:  Negative for shortness of breath.   Cardiovascular:  Negative for chest pain.  Gastrointestinal:  Negative for abdominal pain, diarrhea, nausea and vomiting.  Genitourinary:  Negative for decreased urine volume, difficulty urinating, dysuria, flank pain, frequency, genital sores, hematuria, penile discharge, penile pain, penile swelling, scrotal swelling, testicular pain and urgency.  Musculoskeletal:  Negative for back pain.  Skin:  Negative for rash.  All other systems reviewed and are negative.   Physical Exam Triage Vital Signs ED Triage Vitals [01/05/21 0826]  Enc Vitals Group     BP      Pulse      Resp      Temp      Temp src      SpO2      Weight      Height      Head Circumference      Peak Flow      Pain Score 6     Pain Loc      Pain Edu?      Excl. in GC?    No data found.  Updated Vital Signs BP 132/86 (BP Location: Right Arm) Comment (BP Location): large cuff  Pulse 80   Temp 98.4  F (36.9 C) (Oral)   Resp 20   SpO2 96%   Visual Acuity Right Eye Distance:   Left Eye Distance:   Bilateral Distance:    Right Eye Near:   Left Eye Near:    Bilateral Near:     Physical Exam Vitals reviewed.  Constitutional:      General: He is not in acute distress.    Appearance: Normal appearance. He is not ill-appearing.  HENT:     Head: Normocephalic and atraumatic.     Mouth/Throat:     Mouth: Mucous membranes are moist.     Comments: Moist mucous membranes Eyes:     Extraocular Movements: Extraocular movements intact.     Pupils: Pupils are equal, round, and reactive to light.  Cardiovascular:     Rate and Rhythm: Normal rate and regular rhythm.     Heart sounds: Normal heart sounds.  Pulmonary:     Effort: Pulmonary effort is normal.     Breath sounds: Normal breath sounds. No wheezing, rhonchi or rales.   Abdominal:     General: Bowel sounds are normal. There is no distension.     Palpations: Abdomen is soft. There is no mass.     Tenderness: There is no abdominal tenderness. There is no right CVA tenderness, left CVA tenderness, guarding or rebound.  Genitourinary:    Comments: deferred Skin:    General: Skin is warm.     Capillary Refill: Capillary refill takes less than 2 seconds.     Comments: Good skin turgor  Neurological:     General: No focal deficit present.     Mental Status: He is alert and oriented to person, place, and time.  Psychiatric:        Mood and Affect: Mood normal.        Behavior: Behavior normal.     UC Treatments / Results  Labs (all labs ordered are listed, but only abnormal results are displayed) Labs Reviewed  POCT URINALYSIS DIPSTICK, ED / UC  CBG MONITORING, ED  CYTOLOGY, (ORAL, ANAL, URETHRAL) ANCILLARY ONLY    EKG   Radiology No results found.  Procedures Procedures (including critical care time)  Medications Ordered in UC Medications - No data to display  Initial Impression / Assessment and Plan / UC Course  I have reviewed the triage vital signs and the nursing notes.  Pertinent labs & imaging results that were available during my care of the patient were reviewed by me and considered in my medical decision making (see chart for details).     This patient is a very pleasant 45 y.o. year old male presenting with vague complaint of penile tingling intermittently x2 months following starting on phentermine. Denies any other symptoms. Afebrile, nontachycardic, no reproducible abd pain or CVAT.   History trichomonas 2019 per chart review. Denies new partners.  Will send self-swab for G/C, trich. Declines HIV, RPR. Safe sex precautions.  Nonfasting CBG 93 (checked at patient request, he is not a diabetic). UA wnl, did not send culture.  Symptoms could be related to phentermine; advised discussing this with prescribing provider.  ED  return precautions discussed. Patient verbalizes understanding and agreement.    Final Clinical Impressions(s) / UC Diagnoses   Final diagnoses:  Routine lab draw  History of trichomoniasis  Routine screening for STI (sexually transmitted infection)     Discharge Instructions      -We'll call you in about 2 days if your labwork is abnormal -Follow-up with the  provider who prescribes your weight loss medication if you continue to have symptoms, as this could be contributing     ED Prescriptions   None    PDMP not reviewed this encounter.   Rhys Martini, PA-C 01/05/21 [phone]

## 2021-01-07 LAB — CYTOLOGY, (ORAL, ANAL, URETHRAL) ANCILLARY ONLY
Chlamydia: NEGATIVE
Comment: NEGATIVE
Comment: NEGATIVE
Comment: NORMAL
Neisseria Gonorrhea: NEGATIVE
Trichomonas: NEGATIVE

## 2022-01-09 ENCOUNTER — Encounter (HOSPITAL_COMMUNITY): Payer: Self-pay | Admitting: *Deleted

## 2022-01-09 ENCOUNTER — Ambulatory Visit (HOSPITAL_COMMUNITY)
Admission: EM | Admit: 2022-01-09 | Discharge: 2022-01-09 | Disposition: A | Discharge status: Home / Self Care | Payer: 59 | Attending: Family Medicine | Admitting: Family Medicine

## 2022-01-09 DIAGNOSIS — N342 Other urethritis: Secondary | ICD-10-CM

## 2022-01-09 MED ORDER — DOXYCYCLINE HYCLATE 100 MG PO CAPS: 100.0000 mg | ORAL_CAPSULE | Freq: Two times a day (BID) | ORAL | 0 refills | Status: AC

## 2022-01-09 MED ORDER — CEFTRIAXONE SODIUM 500 MG IJ SOLR
INTRAMUSCULAR | Status: AC
  Filled 2022-01-09: qty 500

## 2022-01-09 MED ORDER — CEFTRIAXONE SODIUM 500 MG IJ SOLR
500.0000 mg | Freq: Once | INTRAMUSCULAR | Status: AC
  Administered 2022-01-09: 500 mg via INTRAMUSCULAR

## 2022-01-09 NOTE — Discharge Instructions (Addendum)
You have been given a shot of ceftriaxone 500 mg; this is for potential infection with gonorrhea  Take doxycycline 100 mg --1 capsule 2 times daily for 7 days; this is for potential infection with chlamydia  We will notify you if anything is positive on your swab.

## 2022-01-09 NOTE — ED Provider Notes (Signed)
[facility]    CSN: [id number] Arrival date & time: 01/09/22  1442      History   Chief Complaint Chief Complaint  Patient presents with   Penile Discharge    HPI [facility] is a 46 y.o. male.    Penile Discharge   Here for 2 to 3 days of dysuria and penile discharge.  No fever or vomiting or abdominal pain.  He requests empiric treatment for STDs. He states he has had HIV and syphilis screening about 3 months ago with his primary care office.  We discussed safe sex practices, and he usually is using barrier protection.  He states that a timer to the condom has broken   Past Medical History:  Diagnosis Date   Allergy     Patient Active Problem List   Diagnosis Date Noted   Gastroesophageal reflux disease with esophagitis, Possible  06/07/2018   BMI 40.0-44.9, adult (HCC) 05/12/2015    Past Surgical History:  Procedure Laterality Date   VASECTOMY  02/15/2016       Home Medications    Prior to Admission medications   Medication Sig Start Date End Date Taking? Authorizing Provider  doxycycline (VIBRAMYCIN) 100 MG capsule Take 1 capsule (100 mg total) by mouth 2 (two) times daily for 7 days. 01/09/22 01/16/22 Yes Zenia Resides, MD  phentermine (ADIPEX-P) 37.5 MG tablet Take 37.5 mg by mouth daily. 11/30/20   [provider]    Family History Family History  Problem Relation Age of Onset   Mental illness Mother    Stroke Mother    Hypertension Father    Heart disease Maternal Grandmother    Hypertension Maternal Grandmother    GER disease Maternal Aunt    Cancer Neg Hx     Social History Social History   Tobacco Use   Smoking status: Never   Smokeless tobacco: Never  Vaping Use   Vaping Use: Never used  Substance Use Topics   Alcohol use: No   Drug use: No     Allergies   Patient has no known allergies.   Review of Systems Review of Systems  Genitourinary:  Positive for penile discharge.     Physical  Exam Triage Vital Signs ED Triage Vitals  Enc Vitals Group     BP 01/09/22 1506 135/88     Pulse Rate 01/09/22 1506 79     Resp 01/09/22 1506 20     Temp 01/09/22 1506 98.2 F (36.8 C)     Temp Source 01/09/22 1506 Oral     SpO2 01/09/22 1506 97 %     Weight --      Height --      Head Circumference --      Peak Flow --      Pain Score 01/09/22 1507 0     Pain Loc --      Pain Edu? --      Excl. in GC? --    No data found.  Updated Vital Signs BP 135/88   Pulse 79   Temp 98.2 F (36.8 C) (Oral)   Resp 20   SpO2 97%   Visual Acuity Right Eye Distance:   Left Eye Distance:   Bilateral Distance:    Right Eye Near:   Left Eye Near:    Bilateral Near:     Physical Exam Vitals reviewed.  Constitutional:      General: He is not in acute distress.    Appearance: He  is not ill-appearing, toxic-appearing or diaphoretic.  HENT:     Mouth/Throat:     Mouth: Mucous membranes are moist.  Cardiovascular:     Rate and Rhythm: Normal rate and regular rhythm.     Heart sounds: No murmur heard. Pulmonary:     Effort: Pulmonary effort is normal.     Breath sounds: Normal breath sounds.  Skin:    Coloration: Skin is not jaundiced or pale.  Neurological:     Mental Status: He is alert and oriented to person, place, and time.  Psychiatric:        Behavior: Behavior normal.      UC Treatments / Results  Labs (all labs ordered are listed, but only abnormal results are displayed) Labs Reviewed  CYTOLOGY, (ORAL, ANAL, URETHRAL) ANCILLARY ONLY    EKG   Radiology No results found.  Procedures Procedures (including critical care time)  Medications Ordered in UC Medications  cefTRIAXone (ROCEPHIN) injection 500 mg (has no administration in time range)    Initial Impression / Assessment and Plan / UC Course  I have reviewed the triage vital signs and the nursing notes.  Pertinent labs & imaging results that were available during my care of the patient were  reviewed by me and considered in my medical decision making (see chart for details).        Self swab is done today.  He is treated empirically today with ceftriaxone and doxycycline.  We will notify him and treat if he is positive for trichomonas  We discussed safe sex practices briefly, and he is given printed education on safe sex practices.  He does not currently need HIV and syphilis screening Final Clinical Impressions(s) / UC Diagnoses   Final diagnoses:  Urethritis     Discharge Instructions      You have been given a shot of ceftriaxone 500 mg; this is for potential infection with gonorrhea  Take doxycycline 100 mg --1 capsule 2 times daily for 7 days; this is for potential infection with chlamydia  We will notify you if anything is positive on your swab.         ED Prescriptions     Medication Sig Dispense Auth. Provider   doxycycline (VIBRAMYCIN) 100 MG capsule Take 1 capsule (100 mg total) by mouth 2 (two) times daily for 7 days. 14 capsule Marlinda Mike, Janace Aris, MD      PDMP not reviewed this encounter.   Zenia Resides, MD 01/09/22 1538

## 2022-01-09 NOTE — ED Triage Notes (Signed)
Pt reports penile discharge with slight irritation onset yesterday. No known exposures.

## 2022-01-10 LAB — CYTOLOGY, (ORAL, ANAL, URETHRAL) ANCILLARY ONLY
Chlamydia: NEGATIVE
Comment: NEGATIVE
Comment: NEGATIVE
Comment: NORMAL
Neisseria Gonorrhea: POSITIVE — AB
Trichomonas: NEGATIVE

## 2022-10-25 ENCOUNTER — Other Ambulatory Visit: Payer: Self-pay

## 2022-10-25 ENCOUNTER — Ambulatory Visit
Admission: EM | Admit: 2022-10-25 | Discharge: 2022-10-25 | Disposition: A | Discharge status: Home / Self Care | Payer: 59 | Attending: Family Medicine | Admitting: Family Medicine

## 2022-10-25 ENCOUNTER — Encounter: Payer: Self-pay | Admitting: Emergency Medicine

## 2022-10-25 DIAGNOSIS — R600 Localized edema: Secondary | ICD-10-CM

## 2022-10-25 NOTE — ED Provider Notes (Signed)
[facility]    CSN: [id number] Arrival date & time: 10/25/22  1158      History   Chief Complaint Chief Complaint  Patient presents with   Leg Swelling    HPI Caleb Frederick is a 47 y.o. male.   HPI Here for swelling in his ankles and lower legs.  He has been noting it more in the last 2 weeks.  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  No shortness of breath and no cough.  No fever.  He will noticed the swelling after he has been in the truck 2 or 3 hours but then it will get better if he goes home to rest and elevate his feet or after he has been lying down all night.    Past Medical History:  Diagnosis Date   Allergy     Patient Active Problem List   Diagnosis Date Noted   Gastroesophageal reflux disease with esophagitis, Possible  06/07/2018   BMI 40.0-44.9, adult (HCC) 05/12/2015    Past Surgical History:  Procedure Laterality Date   VASECTOMY  02/15/2016       Home Medications    Prior to Admission medications   Not on File    Family History Family History  Problem Relation Age of Onset   Mental illness Mother    Stroke Mother    Hypertension Father    Heart disease Maternal Grandmother    Hypertension Maternal Grandmother    GER disease Maternal Aunt    Cancer Neg Hx     Social History Social History   Tobacco Use   Smoking status: Never   Smokeless tobacco: Never  Vaping Use   Vaping Use: Never used  Substance Use Topics   Alcohol use: No   Drug use: No     Allergies   Patient has no known allergies.   Review of Systems Review of Systems   Physical Exam Triage Vital Signs ED Triage Vitals [10/25/22 1230]  Enc Vitals Group     BP 124/86     Pulse Rate 71     Resp 18     Temp 98.3 F (36.8 C)     Temp Source Oral     SpO2 96 %     Weight      Height      Head Circumference      Peak Flow      Pain Score 0     Pain Loc      Pain Edu?      Excl. in GC?     No data found.  Updated Vital Signs BP 124/86 (BP Location: Left Arm)   Pulse 71   Temp 98.3 F (36.8 C) (Oral)   Resp 18   SpO2 96%   Visual Acuity Right Eye Distance:   Left Eye Distance:   Bilateral Distance:    Right Eye Near:   Left Eye Near:    Bilateral Near:     Physical Exam Vitals reviewed.  Constitutional:      General: He is not in acute distress.    Appearance: He is not ill-appearing, toxic-appearing or diaphoretic.  HENT:     Nose: Nose normal.     Mouth/Throat:     Mouth: Mucous membranes are moist.     Pharynx: No oropharyngeal exudate or posterior oropharyngeal erythema.  Eyes:     Extraocular Movements: Extraocular movements intact.  Conjunctiva/sclera: Conjunctivae normal.     Pupils: Pupils are equal, round, and reactive to light.  Cardiovascular:     Rate and Rhythm: Normal rate and regular rhythm.     Heart sounds: No murmur heard. Pulmonary:     Effort: Pulmonary effort is normal.     Breath sounds: Normal breath sounds. No stridor. No wheezing, rhonchi or rales.  Musculoskeletal:     Cervical back: Neck supple.     Comments: There is trace edema in the lower legs near the ankles.  There is no ulceration or erythema.  No calf tenderness or Homans.   Lymphadenopathy:     Cervical: No cervical adenopathy.  Skin:    Coloration: Skin is not jaundiced or pale.  Neurological:     General: No focal deficit present.     Mental Status: He is alert and oriented to person, place, and time.  Psychiatric:        Behavior: Behavior normal.      UC Treatments / Results  Labs (all labs ordered are listed, but only abnormal results are displayed) Labs Reviewed  COMPREHENSIVE METABOLIC PANEL    EKG   Radiology No results found.  Procedures Procedures (including critical care time)  Medications Ordered in UC Medications - No data to display  Initial Impression / Assessment and Plan / UC Course  I have reviewed the triage vital  signs and the nursing notes.  Pertinent labs & imaging results that were available during my care of the patient were reviewed by me and considered in my medical decision making (see chart for details).        CMP is drawn today to ensure that he has normal kidney and liver function.  I suspect he does.  I am recommending compression socks.  Also I have asked him to get out of his truck and walk around about every hour to hour and a half.  Less salt intake might help. Final Clinical Impressions(s) / UC Diagnoses   Final diagnoses:  Pedal edema     Discharge Instructions      We have drawn blood work to check your kidney and liver function and your sodium and potassium.  We will let you know if there is anything significantly abnormal on the blood work.  Do limit your salt intake.  Try getting and walking around during your truck trips every hour to hour and a half.  The muscle movement in your legs can help the veins get fluid out of your feet   Also try wearing compression socks.  You can use the QR code/website at the back of the summary paperwork to schedule yourself a new patient appointment with primary care      ED Prescriptions   None    I have reviewed the PDMP during this encounter.   Zenia Resides, MD 10/25/22 [phone]

## 2022-10-25 NOTE — Discharge Instructions (Signed)
We have drawn blood work to check your kidney and liver function and your sodium and potassium.  We will let you know if there is anything significantly abnormal on the blood work.  Do limit your salt intake.  Try getting and walking around during your truck trips every hour to hour and a half.  The muscle movement in your legs can help the veins get fluid out of your feet   Also try wearing compression socks.  You can use the QR code/website at the back of the summary paperwork to schedule yourself a new patient appointment with primary care

## 2022-10-25 NOTE — ED Triage Notes (Signed)
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

## 2022-10-26 ENCOUNTER — Telehealth (HOSPITAL_COMMUNITY): Payer: Self-pay | Admitting: Emergency Medicine

## 2022-10-26 LAB — COMPREHENSIVE METABOLIC PANEL
ALT: 25 IU/L (ref 0–44)
AST: 23 IU/L (ref 0–40)
Albumin: 4.1 g/dL (ref 4.1–5.1)
Alkaline Phosphatase: 85 IU/L (ref 44–121)
BUN/Creatinine Ratio: 12 (ref 9–20)
BUN: 13 mg/dL (ref 6–24)
Bilirubin Total: <0.2 mg/dL (ref 0.0–1.2)
CO2: 20 mmol/L (ref 20–29)
Calcium: 8.8 mg/dL (ref 8.7–10.2)
Chloride: 106 mmol/L (ref 96–106)
Creatinine, Ser: 1.09 mg/dL (ref 0.76–1.27)
Globulin, Total: 2.5 g/dL (ref 1.5–4.5)
Glucose: 109 mg/dL — ABNORMAL HIGH (ref 70–99)
Potassium: 3.8 mmol/L (ref 3.5–5.2)
Sodium: 143 mmol/L (ref 134–144)
Total Protein: 6.6 g/dL (ref 6.0–8.5)
eGFR: 85 mL/min/{1.73_m2} (ref >59)

## 2022-10-26 NOTE — Telephone Encounter (Signed)
Patient called for review of his blood work, reassured him, and recommended PCP f/u with continued issues, per provider note

## 2023-04-10 ENCOUNTER — Ambulatory Visit (HOSPITAL_COMMUNITY)
Admission: EM | Admit: 2023-04-10 | Discharge: 2023-04-10 | Disposition: A | Discharge status: Home / Self Care | Payer: 59 | Attending: Family Medicine | Admitting: Family Medicine

## 2023-04-10 ENCOUNTER — Encounter (HOSPITAL_COMMUNITY): Payer: Self-pay

## 2023-04-10 DIAGNOSIS — R350 Frequency of micturition: Secondary | ICD-10-CM

## 2023-04-10 LAB — POCT URINALYSIS DIP (MANUAL ENTRY)
Bilirubin, UA: NEGATIVE
Blood, UA: NEGATIVE
Glucose, UA: NEGATIVE mg/dL
Leukocytes, UA: NEGATIVE
Nitrite, UA: NEGATIVE
Protein Ur, POC: NEGATIVE mg/dL
Spec Grav, UA: 1.025 (ref 1.010–1.025)
Urobilinogen, UA: 0.2 U/dL
pH, UA: 5.5 (ref 5.0–8.0)

## 2023-04-10 LAB — POCT FASTING CBG KUC MANUAL ENTRY: POCT Glucose (KUC): 101 mg/dL — AB (ref 70–99)

## 2023-04-10 NOTE — ED Provider Notes (Signed)
[facility]    CSN: [id number] Arrival date & time: 04/10/23  0857      History   Chief Complaint No chief complaint on file.   HPI Caleb Frederick is a 47 y.o. male.   Patient is here for urinary frequency x several weeks.  No painful urination.  No penile d/c.  It feels "funny" down there at times.  No obvious back pain or belly pain.  No fevers/chills.  No increased thirst.  Small risk for stds.  He did eat cookies and coffee this morning.        Past Medical History:  Diagnosis Date   Allergy     Patient Active Problem List   Diagnosis Date Noted   Gastroesophageal reflux disease with esophagitis, Possible  06/07/2018   BMI 40.0-44.9, adult (HCC) 05/12/2015    Past Surgical History:  Procedure Laterality Date   VASECTOMY  02/15/2016       Home Medications    Prior to Admission medications   Not on File    Family History Family History  Problem Relation Age of Onset   Mental illness Mother    Stroke Mother    Hypertension Father    Heart disease Maternal Grandmother    Hypertension Maternal Grandmother    GER disease Maternal Aunt    Cancer Neg Hx     Social History Social History   Tobacco Use   Smoking status: Never   Smokeless tobacco: Never  Vaping Use   Vaping status: Never Used  Substance Use Topics   Alcohol use: No   Drug use: No     Allergies   Patient has no known allergies.   Review of Systems Review of Systems  Constitutional: Negative.   HENT: Negative.    Respiratory: Negative.    Cardiovascular: Negative.   Gastrointestinal: Negative.   Genitourinary:  Positive for frequency.  Musculoskeletal: Negative.   Psychiatric/Behavioral: Negative.       Physical Exam Triage Vital Signs ED Triage Vitals  Encounter Vitals Group     BP 04/10/23 0912 125/86     Systolic BP Percentile --      Diastolic BP Percentile --      Pulse Rate 04/10/23 0912 81     Resp 04/10/23 0912 16     Temp 04/10/23  0912 98.6 F (37 C)     Temp Source 04/10/23 0912 Oral     SpO2 04/10/23 0912 95 %     Weight 04/10/23 0912 275 lb (124.7 kg)     Height 04/10/23 0912 5\' 10"  (1.778 m)     Head Circumference --      Peak Flow --      Pain Score 04/10/23 0911 0     Pain Loc --      Pain Education --      Exclude from Growth Chart --    No data found.  Updated Vital Signs BP 125/86 (BP Location: Left Arm)   Pulse 81   Temp 98.6 F (37 C) (Oral)   Resp 16   Ht 5\' 10"  (1.778 m)   Wt 124.7 kg   SpO2 95%   BMI 39.46 kg/m   Visual Acuity Right Eye Distance:   Left Eye Distance:   Bilateral Distance:    Right Eye Near:   Left Eye Near:    Bilateral Near:     Physical Exam Constitutional:      Appearance: Normal appearance.  Cardiovascular:  Rate and Rhythm: Normal rate and regular rhythm.  Pulmonary:     Effort: Pulmonary effort is normal.     Breath sounds: Normal breath sounds.  Musculoskeletal:     Cervical back: Normal range of motion.  Skin:    General: Skin is warm.  Neurological:     General: No focal deficit present.     Mental Status: He is alert.  Psychiatric:        Mood and Affect: Mood normal.      UC Treatments / Results  Labs (all labs ordered are listed, but only abnormal results are displayed) Labs Reviewed  POCT URINALYSIS DIP (MANUAL ENTRY) - Abnormal; Notable for the following components:      Result Value   Ketones, POC UA trace (5) (*)    All other components within normal limits  POCT FASTING CBG [facility] MANUAL ENTRY - Abnormal; Notable for the following components:   POCT Glucose ([facility]) 101 (*)    All other components within normal limits  URINE CULTURE  CYTOLOGY, (ORAL, ANAL, URETHRAL) ANCILLARY ONLY    EKG   Radiology No results found.  Procedures Procedures (including critical care time)  Medications Ordered in UC Medications - No data to display  Initial Impression / Assessment and Plan / UC Course  I have reviewed the triage vital  signs and the nursing notes.  Pertinent labs & imaging results that were available during my care of the patient were reviewed by me and considered in my medical decision making (see chart for details).    Final Clinical Impressions(s) / UC Diagnoses   Final diagnoses:  Urinary frequency     Discharge Instructions      You were seen today for urinary frequency.  Your urine overall looked normal today.  I will send this for culture for further testing.  You swab will be resulted tomorrow and you will see this on mychart.  You will be notified if there is anything that needs to be treated.  Your blood sugar was normal today, and not a cause for your symptoms.  If you have continued symptoms or worsening, then please return or follow up with your primary care provider.     ED Prescriptions   None    PDMP not reviewed this encounter.   Jannifer Franklin, MD 04/10/23 [phone]

## 2023-04-10 NOTE — ED Triage Notes (Signed)
Patient here today with c/o frequent urination X 1-2 weeks.

## 2023-04-10 NOTE — Discharge Instructions (Addendum)
You were seen today for urinary frequency.  Your urine overall looked normal today.  I will send this for culture for further testing.  You swab will be resulted tomorrow and you will see this on mychart.  You will be notified if there is anything that needs to be treated.  Your blood sugar was normal today, and not a cause for your symptoms.  If you have continued symptoms or worsening, then please return or follow up with your primary care provider.

## 2023-04-11 LAB — URINE CULTURE: Culture: NO GROWTH

## 2023-04-11 LAB — CYTOLOGY, (ORAL, ANAL, URETHRAL) ANCILLARY ONLY
Chlamydia: NEGATIVE
Comment: NEGATIVE
Comment: NEGATIVE
Comment: NORMAL
Neisseria Gonorrhea: NEGATIVE
Trichomonas: NEGATIVE

## 2023-06-20 ENCOUNTER — Ambulatory Visit
Admission: EM | Admit: 2023-06-20 | Discharge: 2023-06-20 | Disposition: A | Discharge status: Home / Self Care | Payer: 59 | Attending: Family Medicine | Admitting: Family Medicine

## 2023-06-20 ENCOUNTER — Encounter: Payer: Self-pay | Admitting: Emergency Medicine

## 2023-06-20 ENCOUNTER — Other Ambulatory Visit: Payer: Self-pay

## 2023-06-20 DIAGNOSIS — J101 Influenza due to other identified influenza virus with other respiratory manifestations: Secondary | ICD-10-CM

## 2023-06-20 LAB — POC COVID19/FLU A&B COMBO
Covid Antigen, POC: NEGATIVE
Influenza A Antigen, POC: POSITIVE — AB
Influenza B Antigen, POC: NEGATIVE

## 2023-06-20 MED ORDER — ACETAMINOPHEN 325 MG PO TABS
650.0000 mg | ORAL_TABLET | Freq: Once | ORAL | Status: AC
  Administered 2023-06-20: 650 mg via ORAL

## 2023-06-20 MED ORDER — OSELTAMIVIR PHOSPHATE 75 MG PO CAPS: 75.0000 mg | ORAL_CAPSULE | Freq: Two times a day (BID) | ORAL | 0 refills | Status: DC

## 2023-06-20 MED ORDER — TIZANIDINE HCL 4 MG PO TABS: 4.0000 mg | ORAL_TABLET | Freq: Three times a day (TID) | ORAL | 0 refills | Status: DC | PRN

## 2023-06-20 MED ORDER — PREDNISONE 20 MG PO TABS: 40.0000 mg | ORAL_TABLET | Freq: Every day | ORAL | 0 refills | Status: AC

## 2023-06-20 NOTE — Discharge Instructions (Addendum)
 Influenza test is positive.  You are considered contagious to others as long as you have a measurable fever with a temperature 100 F.  You should consider yourself infectious until you are fever free for 24 hours without fever lowering medications. Prednisone 40 mg once daily for inflammation that is causing your back pain.  Continue to alternate Tylenol for management of fever.   Force fluids to maintain hydration. Tamiflu twice daily for the next 5 days to reduce symptoms and course of influenza virus.  Prednisone 40 mg for 5 days for low back pain. Tizanidine 4 mg every 8 hours as needed for back pain (medication causes sleepiness).  If you develop any shortness of breath, wheezing or difficulty breathing go immediately to the nearest emergency department.

## 2023-06-20 NOTE — ED Provider Notes (Signed)
 [facility]    CSN: [id number] Arrival date & time: 06/20/23  0947      History   Chief Complaint Chief Complaint  Patient presents with   Fever    HPI Benicio Manna is a 48 y.o. male.    Fever Patient here today with a 2-3 day history of lower back pain, cough, fever, and headache.  Patient is febrile on arrival with a temp of 101.4.  He endorses increased work of breathing but denies overt shortness of breath, chest tightness or generalized weakness.  He has not taken any medications for his symptoms.  Past Medical History:  Diagnosis Date   Allergy     Patient Active Problem List   Diagnosis Date Noted   Gastroesophageal reflux disease with esophagitis, Possible  06/07/2018   BMI 40.0-44.9, adult (HCC) 05/12/2015    Past Surgical History:  Procedure Laterality Date   VASECTOMY  02/15/2016       Home Medications    Prior to Admission medications   Medication Sig Start Date End Date Taking? Authorizing Provider  oseltamivir (TAMIFLU) 75 MG capsule Take 1 capsule (75 mg total) by mouth 2 (two) times daily. 06/20/23  Yes Bing Neighbors, NP  predniSONE (DELTASONE) 20 MG tablet Take 2 tablets (40 mg total) by mouth daily with breakfast for 5 days. 06/20/23 06/25/23 Yes Bing Neighbors, NP  tiZANidine (ZANAFLEX) 4 MG tablet Take 1 tablet (4 mg total) by mouth every 8 (eight) hours as needed for muscle spasms. 06/20/23  Yes Bing Neighbors, NP    Family History Family History  Problem Relation Age of Onset   Mental illness Mother    Stroke Mother    Hypertension Father    Heart disease Maternal Grandmother    Hypertension Maternal Grandmother    GER disease Maternal Aunt    Cancer Neg Hx     Social History Social History   Tobacco Use   Smoking status: Never   Smokeless tobacco: Never  Vaping Use   Vaping status: Never Used  Substance Use Topics   Alcohol use: No   Drug use: No     Allergies   Patient has no known  allergies.   Review of Systems Review of Systems  Constitutional:  Positive for fever.     Physical Exam Triage Vital Signs ED Triage Vitals [06/20/23 1214]  Encounter Vitals Group     BP (!) 141/74     Systolic BP Percentile      Diastolic BP Percentile      Pulse Rate 88     Resp 18     Temp (!) 101.4 F (38.6 C)     Temp Source Oral     SpO2 94 %     Weight      Height      Head Circumference      Peak Flow      Pain Score 7     Pain Loc      Pain Education      Exclude from Growth Chart    No data found.  Updated Vital Signs BP (!) 141/74 (BP Location: Left Arm)   Pulse 88   Temp (!) 101.4 F (38.6 C) (Oral)   Resp 18   SpO2 94%   Visual Acuity Right Eye Distance:   Left Eye Distance:   Bilateral Distance:    Right Eye Near:   Left Eye Near:    Bilateral Near:  Physical Exam Constitutional:      Appearance: He is well-developed. He is ill-appearing.  HENT:     Head: Normocephalic and atraumatic.     Nose: Congestion and rhinorrhea present.  Eyes:     Extraocular Movements: Extraocular movements intact.     Pupils: Pupils are equal, round, and reactive to light.  Neck:     Thyroid: No thyromegaly.     Trachea: No tracheal deviation.  Cardiovascular:     Rate and Rhythm: Normal rate and regular rhythm.  Pulmonary:     Effort: Pulmonary effort is normal.     Breath sounds: Wheezing and rhonchi present.  Abdominal:     General: Bowel sounds are normal. There is no distension.     Palpations: Abdomen is soft.     Tenderness: There is no abdominal tenderness.  Musculoskeletal:        General: Normal range of motion.     Cervical back: Normal range of motion and neck supple.  Skin:    General: Skin is warm and dry.  Neurological:     General: No focal deficit present.     Mental Status: He is alert.  Psychiatric:        Judgment: Judgment normal.      UC Treatments / Results  Labs (all labs ordered are listed, but only abnormal  results are displayed) Labs Reviewed  POC COVID19/FLU A&B COMBO - Abnormal; Notable for the following components:      Result Value   Influenza A Antigen, POC Positive (*)    All other components within normal limits    EKG   Radiology No results found.  Procedures Procedures (including critical care time)  Medications Ordered in UC Medications  acetaminophen (TYLENOL) tablet 650 mg (650 mg Oral Given 06/20/23 1215)    Initial Impression / Assessment and Plan / UC Course  I have reviewed the triage vital signs and the nursing notes.  Pertinent labs & imaging results that were available during my care of the patient were reviewed by me and considered in my medical decision making (see chart for details).    Influenza A confirmed by point-of-care testing.  Tamiflu treatment dose prescribed.  Patient is actively wheezing on exam prescribed prednisone 40 mg twice daily due to inflammation that is causing wheezing.  Promethazine DM for cough.  Return precautions given if symptoms do not improve.  ER precautions given if any red flag symptoms develop.  Patient verbalized understanding and agreement with plan. Final Clinical Impressions(s) / UC Diagnoses   Final diagnoses:  Influenza A     Discharge Instructions      Influenza test is positive.  You are considered contagious to others as long as you have a measurable fever with a temperature 100 F.  You should consider yourself infectious until you are fever free for 24 hours without fever lowering medications. Prednisone 40 mg once daily for inflammation that is causing your back pain.  Continue to alternate Tylenol for management of fever.   Force fluids to maintain hydration. Tamiflu twice daily for the next 5 days to reduce symptoms and course of influenza virus.  Prednisone 40 mg for 5 days for low back pain. Tizanidine 4 mg every 8 hours as needed for back pain (medication causes sleepiness).  If you develop any shortness  of breath, wheezing or difficulty breathing go immediately to the nearest emergency department.       ED Prescriptions     Medication Sig Dispense  Auth. Provider   oseltamivir (TAMIFLU) 75 MG capsule Take 1 capsule (75 mg total) by mouth 2 (two) times daily. 10 capsule Bing Neighbors, NP   predniSONE (DELTASONE) 20 MG tablet Take 2 tablets (40 mg total) by mouth daily with breakfast for 5 days. 10 tablet Bing Neighbors, NP   tiZANidine (ZANAFLEX) 4 MG tablet Take 1 tablet (4 mg total) by mouth every 8 (eight) hours as needed for muscle spasms. 30 tablet Bing Neighbors, NP      PDMP not reviewed this encounter.   Bing Neighbors, NP 06/22/23 [phone]

## 2023-06-20 NOTE — ED Triage Notes (Signed)
 Pt here for cough and fever x 3 days; pt sts lower back pain and spasms

## 2024-02-07 LAB — HEMOGLOBIN A1C: Hemoglobin A1C: 6

## 2024-02-08 LAB — HEPATITIS B SURFACE ANTIGEN

## 2024-02-08 LAB — BASIC METABOLIC PANEL WITH GFR
BUN: 10 (ref 4–21)
Creatinine: 1.1 (ref 0.6–1.3)
EGFR: 81.2
Glucose: 107

## 2024-02-08 LAB — LIPID PANEL
Cholesterol: 253 — AB (ref 0–200)
HDL: 36 (ref 35–70)
LDL Cholesterol: 189
Triglycerides: 141 (ref 40–160)

## 2024-02-08 LAB — HM HIV SCREENING LAB: HM HIV Screening: NEGATIVE

## 2024-02-08 LAB — HM HEPATITIS C SCREENING LAB: HM Hepatitis Screen: NEGATIVE

## 2024-02-08 LAB — HIV ANTIBODY (ROUTINE TESTING W REFLEX)

## 2024-02-13 ENCOUNTER — Encounter: Payer: Self-pay | Admitting: Family Medicine

## 2024-02-13 ENCOUNTER — Ambulatory Visit: Admitting: Family Medicine

## 2024-02-13 VITALS — BP 118/74 | HR 64 | Temp 97.9°F | Ht 70.0 in | Wt 312.4 lb

## 2024-02-13 DIAGNOSIS — Z23 Encounter for immunization: Secondary | ICD-10-CM

## 2024-02-13 DIAGNOSIS — J3089 Other allergic rhinitis: Secondary | ICD-10-CM

## 2024-02-13 DIAGNOSIS — J309 Allergic rhinitis, unspecified: Secondary | ICD-10-CM | POA: Insufficient documentation

## 2024-02-13 DIAGNOSIS — Z6841 Body Mass Index (BMI) 40.0 and over, adult: Secondary | ICD-10-CM | POA: Diagnosis not present

## 2024-02-13 NOTE — Assessment & Plan Note (Signed)
 Recommend he add a nasal steroid spray to his daily cetirizine 10 mg.

## 2024-02-13 NOTE — Assessment & Plan Note (Addendum)
 Discussed principles of weight management, including a foundation of a healthy, calorie-controlled diet and regular exercise. I recommend the patient achieve 150 minutes of moderate-intensity exercise weekly. We did discuss the role of medication to augment dietary and exercise efforts. I recommend he consider obtaining tirzepatide (Zepbound) through [employer], if his insurance does not cover GLP-1 RAs. He will reach out to me via My Chart based on what he finds out from his insurance.

## 2024-02-13 NOTE — Progress Notes (Deleted)
  [facility] [address] [place] [address] Dept: [phone] Dept Fax: [phone]  Chronic Care Office Visit  Subjective:    Patient ID: C[facility], male    DOB: 22-Apr-1976, 48 y.o..   MRN: [id number]  Chief Complaint  Patient presents with   Establish Care    NP- establish care.  Discuss weight loss.  Declines flu shot.    History of Present Illness:  Patient is in today for reassessment of chronic medical issues.  Past Medical History: Patient Active Problem List   Diagnosis Date Noted   Morbid obesity with BMI of 40.0-44.9, adult (HCC) 05/12/2015   Past Surgical History:  Procedure Laterality Date   VASECTOMY  02/15/2016   Family History  Problem Relation Age of Onset   Mental illness Mother    Stroke Mother    Hypertension Father    Heart disease Maternal Grandmother    Hypertension Maternal Grandmother    GER disease Maternal Aunt    Cancer Neg Hx    Outpatient Medications Prior to Visit  Medication Sig Dispense Refill   cetirizine (ZYRTEC) 10 MG tablet Take 10 mg by mouth daily.     oseltamivir  (TAMIFLU ) 75 MG capsule Take 1 capsule (75 mg total) by mouth 2 (two) times daily. 10 capsule 0   tiZANidine  (ZANAFLEX ) 4 MG tablet Take 1 tablet (4 mg total) by mouth every 8 (eight) hours as needed for muscle spasms. 30 tablet 0   No facility-administered medications prior to visit.   No Known Allergies Objective:   Today's Vitals   02/13/24 1418  BP: 118/74  Pulse: 64  Temp: 97.9 F (36.6 C)  TempSrc: Temporal  SpO2: 99%  Weight: (!) 312 lb 6.4 oz (141.7 kg)  Height: 5' 10 (1.778 m)   Body mass index is 44.82 kg/m.   General: Well developed, well nourished. No acute distress. HEENT: Normocephalic, non-traumatic. External ears normal. EAC and TMs normal bilaterally. PERRL, EOMI. Conjunctiva clear. Nose   clear without congestion or rhinorrhea. Mucous membranes moist. Oropharynx clear.  Good dentition. Neck: Supple. No lymphadenopathy. No thyromegaly. Lungs: Clear to auscultation bilaterally. No wheezing, rales or rhonchi. CV: RRR without murmurs or rubs. Pulses 2+ bilaterally. Abdomen: Soft, non-tender. Bowel sounds positive, normal pitch and frequency. No hepatosplenomegaly. No rebound or guarding. Back: Straight. No CVA tenderness bilaterally. Extremities: Full ROM. No joint swelling or tenderness. No edema noted. Skin: Warm and dry. No rashes. Neuro: CN II-XII intact. Normal sensation and DTR bilaterally. Psych: Alert and oriented. Normal mood and affect.  Health Maintenance Due  Topic Date Due   HIV Screening  Never done   Hepatitis C Screening  Never done   DTaP/Tdap/Td (1 - Tdap) Never done   Hepatitis B Vaccines 19-59 Average Risk (2 of 3 - 19+ 3-dose series) 01/04/2001   Colonoscopy  Never done   Influenza Vaccine  12/01/2023    Lab Results {Labs (Optional):29002}    Assessment & Plan:   Problem List Items Addressed This Visit       Other   Morbid obesity with BMI of 40.0-44.9, adult (HCC) - Primary   Other Visit Diagnoses       Need for Tdap vaccination       Relevant Orders   Tdap vaccine greater than or equal to 7yo IM       Return for 2 months, if starts on GLP-1.   [name], MD

## 2024-02-13 NOTE — Progress Notes (Signed)
 [facility]ARE [facility] [address] [place] [address] Dept: [phone] Dept Fax: [phone]  New Patient Office Visit  Subjective:    Patient ID: Caleb Frederick, male    DOB: Mar 22, 1976, 48 y.o..   MRN: [id number]  Chief Complaint  Patient presents with   Establish Care    NP- establish care.  Discuss weight loss.  Declines flu shot.    History of Present Illness:  Patient is in today to establish care. Caleb Frederick was born near [place], [place], as his father was in [place]. The family moved to [place] when returning to [place] . He spent his childhood between [place] and [place], [place]. He attended [employer] and received an AA degree in general studies. He also attended [employer], receiving a certificate in Designer, fashion/clothing. Caleb Frederick has been married for over 17 years. he has two children (8, 12). He denies use of alcohol, tobacco, or drugs.  Caleb Frederick has a history of allergic rhinitis. He uses cetirizine much of the year. This is not always in good control.  Mr. Mottola notes he has always been hefty. 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 He has had times where he was able to increase his exercise and restrict his diet and loose weight (65 lbs). 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 He finds binging on snack foods to be an issue. He was previously treated with phentermine and did have some weight loss.  Past Medical History: Patient Active Problem List   Diagnosis Date Noted   Morbid obesity with BMI of 40.0-44.9, adult (HCC) 05/12/2015   Past Surgical History:  Procedure Laterality Date   VASECTOMY  02/15/2016   Family History  Problem Relation Age of Onset   Mental illness Mother    Stroke Mother    Hypertension Father    Heart disease Maternal Grandmother     Hypertension Maternal Grandmother    GER disease Maternal Aunt    Cancer Neg Hx    Outpatient Medications Prior to Visit  Medication Sig Dispense Refill   cetirizine (ZYRTEC) 10 MG tablet Take 10 mg by mouth daily.     oseltamivir  (TAMIFLU ) 75 MG capsule Take 1 capsule (75 mg total) by mouth 2 (two) times daily. 10 capsule 0   tiZANidine  (ZANAFLEX ) 4 MG tablet Take 1 tablet (4 mg total) by mouth every 8 (eight) hours as needed for muscle spasms. 30 tablet 0   No facility-administered medications prior to visit.   No Known Allergies Objective:   Today's Vitals   02/13/24 1418  BP: 118/74  Pulse: 64  Temp: 97.9 F (36.6 C)  TempSrc: Temporal  SpO2: 99%  Weight: (!) 312 lb 6.4 oz (141.7 kg)  Height: 5' 10 (1.778 m)   Body mass index is 44.82 kg/m.   General: Well developed, well nourished. No acute distress. Psych: Alert and oriented. Normal mood and affect.  Health Maintenance Due  Topic Date Due   HIV Screening  Never done   Hepatitis C Screening  Never done   Hepatitis B Vaccines 19-59 Average Risk (2 of 3 - 19+ 3-dose series) 01/04/2001   Colonoscopy  Never done     Assessment & Plan:   Problem List Items Addressed This Visit       Respiratory   Allergic rhinitis   Recommend  he add a nasal steroid spray to his daily cetirizine 10 mg.         Other   Morbid obesity with BMI of 40.0-44.9, adult (HCC) - Primary   Discussed principles of weight management, including a foundation of a healthy, calorie-controlled diet and regular exercise. I recommend the patient achieve 150 minutes of moderate-intensity exercise weekly. We did discuss the role of medication to augment dietary and exercise efforts. I recommend he consider obtaining tirzepatide (Zepbound) through [facility], if his insurance does not cover GLP-1 RAs. He will reach out to me via My Chart based on what he finds out from his insurance.       Other Visit Diagnoses       Need for Tdap vaccination        Relevant Orders   Tdap vaccine greater than or equal to 7yo IM (Completed)     Flu vaccine need       Relevant Orders   Flu vaccine trivalent PF, 6mos and older(Flulaval,Afluria,Fluarix,Fluzone) (Completed)       Return for 2 months, if starts on GLP-1.   [name], MD

## 2024-02-14 ENCOUNTER — Other Ambulatory Visit (HOSPITAL_COMMUNITY): Payer: Self-pay

## 2024-02-16 ENCOUNTER — Telehealth: Payer: Self-pay

## 2024-02-16 MED ORDER — ZEPBOUND 5 MG/0.5ML ~~LOC~~ SOLN: 5.0000 mg | SUBCUTANEOUS | 2 refills | Status: DC

## 2024-02-16 MED ORDER — ZEPBOUND 2.5 MG/0.5ML ~~LOC~~ SOLN: 2.5000 mg | SUBCUTANEOUS | 0 refills | Status: DC

## 2024-02-16 NOTE — Telephone Encounter (Signed)
 Copied from CRM #[id number]. Topic: Clinical - Medication Prior Auth >> Feb 16, 2024  3:01 PM Paige D wrote: Reason for CRM: Pt calling in regards to Zepbound pt states insurance sent over a PA request and insurance has not received anything back from pcp office please reach out to pt in regards to this status update

## 2024-02-16 NOTE — Telephone Encounter (Signed)
 Can you please send the RX to [facility]. thanks. Dm/cma

## 2024-02-16 NOTE — Addendum Note (Signed)
 Addended by: THEDORA GARNETTE HERO on: 02/16/2024 05:03 PM   Modules accepted: Orders

## 2024-02-21 NOTE — Telephone Encounter (Unsigned)
 Copied from CRM #[id number]. Topic: General - Other >> Feb 21, 2024  2:22 PM Suzen RAMAN wrote: Reason for CRM: patient would like called back from Dr. Thedora nurse pertaining to some additional question about Zepbound.    CB#[phone] preferably after 3:00 pm because he is currently walking into an appt.

## 2024-02-21 NOTE — Telephone Encounter (Signed)
 Spoke to patient and scheduled him an appointmetn to come in on 02/23/24 for training on how to do the shot.  Dm/cma

## 2024-02-23 ENCOUNTER — Ambulatory Visit

## 2024-02-23 NOTE — Progress Notes (Addendum)
 Pt was seen in office for nurse visit for Zepbound training. Pt had all supplies and medication. Training was given by Ernestyne Caldwell and [place].  Patient administered injection himself with verbal instruction given. Right abdomen.Pt understood all training had no question or concerns and would follow up at his next office visit.   Lot number [id number] Exp date 08/20/25

## 2024-02-27 ENCOUNTER — Encounter: Payer: Self-pay | Admitting: Family Medicine

## 2024-02-27 DIAGNOSIS — E785 Hyperlipidemia, unspecified: Secondary | ICD-10-CM | POA: Insufficient documentation

## 2024-02-27 DIAGNOSIS — R7303 Prediabetes: Secondary | ICD-10-CM | POA: Insufficient documentation

## 2024-02-28 ENCOUNTER — Encounter: Payer: Self-pay | Admitting: Family Medicine

## 2024-03-08 ENCOUNTER — Telehealth: Payer: Self-pay | Admitting: Family Medicine

## 2024-03-08 NOTE — Telephone Encounter (Signed)
 Copied from CRM #[id number]. Topic: Clinical - Medication Question >> Mar 08, 2024  2:20 PM Robinson H wrote: Reason for CRM: Patient calling in regarding his tirzepatide (ZEPBOUND) 2.5 MG/0.5ML injection vial or tirzepatide (ZEPBOUND) 5 MG/0.5ML injection vial(patient not sure which one), asking if provider is going to increase his dosage.  Kyian [phone]

## 2024-03-11 NOTE — Telephone Encounter (Signed)
 Called patient and notified him that the 5 mg dose was sent to pharmacy already.  Dm/cma

## 2024-03-14 ENCOUNTER — Ambulatory Visit: Payer: Self-pay | Admitting: Family Medicine

## 2024-03-14 DIAGNOSIS — E781 Pure hyperglyceridemia: Secondary | ICD-10-CM

## 2024-04-15 ENCOUNTER — Encounter: Payer: Self-pay | Admitting: Family Medicine

## 2024-04-15 ENCOUNTER — Ambulatory Visit: Admitting: Family Medicine

## 2024-04-15 VITALS — BP 116/78 | HR 81 | Temp 97.9°F | Ht 70.0 in | Wt 282.2 lb

## 2024-04-15 DIAGNOSIS — E782 Mixed hyperlipidemia: Secondary | ICD-10-CM | POA: Diagnosis not present

## 2024-04-15 DIAGNOSIS — Z6841 Body Mass Index (BMI) 40.0 and over, adult: Secondary | ICD-10-CM | POA: Diagnosis not present

## 2024-04-15 MED ORDER — ROSUVASTATIN CALCIUM 10 MG PO TABS: 10.0000 mg | ORAL_TABLET | Freq: Every day | ORAL | 3 refills | Status: AC

## 2024-04-15 NOTE — Assessment & Plan Note (Signed)
 Continue daily cetirizine 10 mg.

## 2024-04-15 NOTE — Assessment & Plan Note (Signed)
 Caleb Frederick has been on atorvastatin prior to establishing care with me. He notes he has some marked muscle aches associated with its use. This has been impairing his exercise. I will try switching him to rosuvastatin . I recommend he take a daily CoQ-10 supplement.

## 2024-04-15 NOTE — Assessment & Plan Note (Addendum)
 Maximum weight: 312 lbs (01/2024) Current weight: 282 lbs Weight change since last visit: - 30 lbs Total weight loss: -30 lbs (9.6 %)   Congratulated Caleb Frederick on his initial weight loss. Continue tirzepatide  5 mg weekly. We discussed that if his weight loss starts to stall, we would consider going up to 7.5 mg weekly.

## 2024-04-15 NOTE — Progress Notes (Signed)
 [facility] [address] [place] [address] Dept: [phone] Dept Fax: [phone]  Chronic Care Office Visit  Subjective:    Patient ID: Caleb Frederick, male    DOB: 09-21-1975, 48 y.o..   MRN: [id number]  Chief Complaint  Patient presents with   Follow-up    2 month f/u weight.  Down 30 lbs.     History of Present Illness:  Patient is in today for reassessment of chronic medical conditions.   When he first established with me, Caleb Frederick noted he'd always been hefty. 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 Reportedly, he has had times where he was able to increase his exercise and restrict his diet and loose weight (65 lbs). 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 He has previously been treated with phentermine and did have some weight loss. I had prescribed tirzepatide  (Mounjaro) 2.5 mg weekly. He has now been able to increase this to 5 mg. He has been pleased with his response so far.    Past Medical History: Patient Active Problem List   Diagnosis Date Noted   Hyperlipidemia 02/27/2024   Prediabetes 02/27/2024   Allergic rhinitis 02/13/2024   Morbid obesity with BMI of 40.0-44.9, adult (HCC) 05/12/2015   Past Surgical History:  Procedure Laterality Date   VASECTOMY  02/15/2016   Family History  Problem Relation Age of Onset   Mental illness Mother    Stroke Mother    Hypertension Father    Heart disease Maternal Aunt    GER disease Maternal Aunt    Heart disease Maternal Grandmother    Hypertension Maternal Grandmother    Cancer Neg Hx    Outpatient Medications Prior to Visit  Medication Sig Dispense Refill   cetirizine (ZYRTEC) 10 MG tablet Take 10 mg by mouth daily.     tirzepatide  (ZEPBOUND ) 2.5 MG/0.5ML injection vial Inject 2.5 mg into the skin once a week. 2  mL 0   tirzepatide  (ZEPBOUND ) 5 MG/0.5ML injection vial Inject 5 mg into the skin once a week. Start after 28 days on the 2.5 mg weekly dose 2 mL 2   No facility-administered medications prior to visit.   Allergies[1]   Objective:   Today's Vitals   04/15/24 1405  BP: 116/78  Pulse: 81  Temp: 97.9 F (36.6 C)  TempSrc: Temporal  SpO2: 98%  Weight: 282 lb 3.2 oz (128 kg)  Height: 5' 10 (1.778 m)   Body mass index is 40.49 kg/m.   General: Well developed, well nourished. No acute distress. Psych: Alert and oriented. Normal mood and affect.  Health Maintenance Due  Topic Date Due   Hepatitis B Vaccines 19-59 Average Risk (2 of 3 - 19+ 3-dose series) 01/04/2001   Colonoscopy  Never done   COVID-19 Vaccine (4 - 2025-26 season) 01/01/2024   Lab Results Lipid Panel:  Lab Results  Component Value Date   CHOL 253 (A) 02/08/2024   HDL 36 02/08/2024   LDLCALC 189 02/08/2024   TRIG 141 02/08/2024   Lab Results  Component Value Date   HGBA1C 6.0 02/07/2024   Assessment & Plan:   Problem List Items Addressed This Visit       Other   Hyperlipidemia   Caleb Frederick has been on atorvastatin prior to establishing care with me. He notes he has some marked  muscle aches associated with its use. This has been impairing his exercise. I will try switching him to rosuvastatin . I recommend he take a daily CoQ-10 supplement.      Relevant Medications   rosuvastatin  (CRESTOR ) 10 MG tablet   Morbid obesity with BMI of 40.0-44.9, adult (HCC) - Primary   Maximum weight: 312 lbs (01/2024) Current weight: 282 lbs Weight change since last visit: - 30 lbs Total weight loss: -30 lbs (9.6 %)   Congratulated Caleb Frederick on his initial weight loss. Continue tirzepatide  5 mg weekly. We discussed that if his weight loss starts to stall, we would consider going up to 7.5 mg weekly.       No follow-ups on file.   [name], MD  I,Emily Lagle,acting as a scribe for [name],  MD.,have documented all relevant documentation on the behalf of [name], MD.  I, [name], MD, have reviewed all documentation for this visit. The documentation on 04/15/2024 for the exam, diagnosis, procedures, and orders are all accurate and complete.     [1] No Known Allergies

## 2024-05-03 ENCOUNTER — Telehealth: Payer: Self-pay | Admitting: Family Medicine

## 2024-05-03 MED ORDER — ZEPBOUND 7.5 MG/0.5ML ~~LOC~~ SOLN: 7.5000 mg | SUBCUTANEOUS | 2 refills | Status: AC

## 2024-05-03 NOTE — Telephone Encounter (Signed)
 Copied from CRM [id number]. Topic: Clinical - Prescription Issue >> May 03, 2024 11:52 AM Caleb Frederick wrote: Reason for CRM: Patient called in requesting that Dr Thedora send in a higher dosage for his zepbound , and he is requesting a call back [phone].

## 2024-05-06 NOTE — Telephone Encounter (Signed)
 Left detailed VM that RX was sent to [facility] and if any issues to return call. Dm/cma

## 2024-06-17 ENCOUNTER — Ambulatory Visit: Admitting: Family Medicine
